# Patient Record
Sex: Female | Born: 1950 | Race: White | Hispanic: No | Marital: Married | Smoking: Never smoker
Health system: Southern US, Community
[De-identification: ages and names within clinical notes are randomized; demographics above are authoritative.]

## PROBLEM LIST (undated history)

## (undated) ENCOUNTER — Emergency Department (HOSPITAL_COMMUNITY): Admission: EM | Payer: No Typology Code available for payment source | Source: Home / Self Care

## (undated) DIAGNOSIS — E7800 Pure hypercholesterolemia, unspecified: Secondary | ICD-10-CM

## (undated) DIAGNOSIS — I10 Essential (primary) hypertension: Secondary | ICD-10-CM

## (undated) DIAGNOSIS — T148XXA Other injury of unspecified body region, initial encounter: Secondary | ICD-10-CM

## (undated) DIAGNOSIS — I83009 Varicose veins of unspecified lower extremity with ulcer of unspecified site: Secondary | ICD-10-CM

## (undated) DIAGNOSIS — R51 Headache: Secondary | ICD-10-CM

## (undated) DIAGNOSIS — Q639 Congenital malformation of kidney, unspecified: Secondary | ICD-10-CM

## (undated) DIAGNOSIS — F0390 Unspecified dementia without behavioral disturbance: Secondary | ICD-10-CM

## (undated) DIAGNOSIS — K219 Gastro-esophageal reflux disease without esophagitis: Secondary | ICD-10-CM

## (undated) DIAGNOSIS — L97909 Non-pressure chronic ulcer of unspecified part of unspecified lower leg with unspecified severity: Secondary | ICD-10-CM

## (undated) DIAGNOSIS — R519 Headache, unspecified: Secondary | ICD-10-CM

## (undated) DIAGNOSIS — R410 Disorientation, unspecified: Secondary | ICD-10-CM

## (undated) HISTORY — DX: Headache, unspecified: R51.9

## (undated) HISTORY — DX: Congenital malformation of kidney, unspecified: Q63.9

## (undated) HISTORY — DX: Headache: R51

## (undated) HISTORY — PX: ABDOMINAL HYSTERECTOMY: SHX81

## (undated) HISTORY — PX: CHOLECYSTECTOMY: SHX55

## (undated) HISTORY — DX: Pure hypercholesterolemia, unspecified: E78.00

## (undated) HISTORY — PX: APPENDECTOMY: SHX54

---

## 2016-07-22 ENCOUNTER — Ambulatory Visit (INDEPENDENT_AMBULATORY_CARE_PROVIDER_SITE_OTHER): Payer: Medicare Other | Admitting: Diagnostic Neuroimaging

## 2016-07-22 ENCOUNTER — Encounter: Payer: Self-pay | Admitting: Diagnostic Neuroimaging

## 2016-07-22 VITALS — BP 145/77 | HR 72 | Ht 64.0 in | Wt 190.0 lb

## 2016-07-22 DIAGNOSIS — R4689 Other symptoms and signs involving appearance and behavior: Secondary | ICD-10-CM | POA: Diagnosis not present

## 2016-07-22 DIAGNOSIS — R413 Other amnesia: Secondary | ICD-10-CM

## 2016-07-22 DIAGNOSIS — E538 Deficiency of other specified B group vitamins: Secondary | ICD-10-CM | POA: Diagnosis not present

## 2016-07-22 NOTE — Patient Instructions (Addendum)
Thank you for coming to see [place] at [facility]. I hope we have been able to provide you high quality care today.  You may receive a patient satisfaction survey over the next few weeks. We would appreciate your feedback and comments so that we may continue to improve ourselves and the health of our patients.  - continue B12 injections  - caution with bills, staying alone, driving  - look up MIND and DASH diets     ~~~~~~~~~~~~~~~~~~~~~~~~~~~~~~~~~~~~~~~~~~~~~~~~~~~~~~~~~~~~~~~~~  DR. Dalonda Simoni'S GUIDE TO HAPPY AND HEALTHY LIVING These are some of my general health and wellness recommendations. Some of them may apply to you better than others. Please use common sense as you try these suggestions and feel free to ask me any questions.   ACTIVITY/FITNESS Mental, social, emotional and physical stimulation are very important for brain and body health. Try learning a new activity (arts, music, language, sports, games).  Keep moving your body to the best of your abilities. You can do this at home, inside or outside, the park, community center, gym or anywhere you like. Consider a physical therapist or personal trainer to get started. Consider the app Sworkit. Fitness trackers such as smart-watches, smart-phones or Fitbits can help as well.   NUTRITION Eat more plants: colorful vegetables, nuts, seeds and berries.  Eat less sugar, salt, preservatives and processed foods.  Avoid toxins such as cigarettes and alcohol.  Drink water when you are thirsty. Warm water with a slice of lemon is an excellent morning drink to start the day.  Consider these websites for more information The Nutrition Source (https://www.henry-hernandez.biz/) Precision Nutrition (WindowBlog.ch)   RELAXATION Consider practicing mindfulness meditation or other relaxation techniques such as deep breathing, prayer, yoga, tai chi, massage. See website  mindful.org or the apps Headspace or Calm to help get started.   SLEEP Try to get at least 7-8+ hours sleep per day. Regular exercise and reduced caffeine will help you sleep better. Practice good sleep hygeine techniques. See website sleep.org for more information.   PLANNING Prepare estate planning, living will, healthcare POA documents. Sometimes this is best planned with the help of an attorney. Theconversationproject.org and agingwithdignity.org are excellent resources.

## 2016-07-22 NOTE — Progress Notes (Signed)
[facility]  PATIENT: Hailey Ortiz DOB: [birth year]  REFERRING CLINICIAN: P Eason HISTORY FROM: patient, husband, daughter REASON FOR VISIT: new consult    HISTORICAL  CHIEF COMPLAINT:  Chief Complaint  Patient presents with  . Memory Loss    rm 7, husband-Kenyt, dgtr- Melody, MMSE 24    HISTORY OF PRESENT ILLNESS:   65 year old female here for evaluation of memory loss, personality change, language difficulty. Patient has history of hypercholesterolemia. September 2016 patient had onset of memory loss, personality behavior change. She also changed her diet to yogurt only for 3 months. Patient was having decreased by mouth intake. In the last year she has lost 156 pounds. In July 2017 patient was having increasing language difficulty, balance and gait difficulty, falling down. Patient was evaluated with lab testing and MRI of the brain. Patient was found to have vitamin B12 level of 80. She started on replacement approximately 4 weeks ago. Symptoms have slightly improved. MRI of the brain showed chronic small vessel ischemic changes and mild atrophy.  Patient has been slightly more argumentative, confused, and having difficulty keeping up her day-to-day activities. Apparently she still takes her own medications, drives a car, shops.     REVIEW OF SYSTEMS: Full 14 system review of systems performed and negative with exception of: Weight loss fatigue swelling in legs diarrhea feeling cold increased thirst frequent infections depression change in appetite disinterest in activities insomnia sleepiness memory loss confusion headache.  ALLERGIES: Allergies  Allergen Reactions  . Furosemide Nausea And Vomiting and Other (See Comments)    Passes out   . Lisinopril     She does not want to take it    HOME MEDICATIONS: No outpatient prescriptions prior to visit.   No facility-administered medications prior to visit.     PAST MEDICAL HISTORY: Past Medical  History:  Diagnosis Date  . Congenital abnormality of kidney   . Headache    migraine  . Hypercholesteremia   . Stroke [facility])     PAST SURGICAL HISTORY: Past Surgical History:  Procedure Laterality Date  . ABDOMINAL HYSTERECTOMY     total  . APPENDECTOMY    . CHOLECYSTECTOMY      FAMILY HISTORY: Family History  Problem Relation Age of Onset  . Heart failure Mother   . Kidney Stones Mother   . Macular degeneration Mother   . Rectal cancer Father   . Arthritis/Rheumatoid Father   . Cancer Father     lymph node    SOCIAL HISTORY:  Social History   Social History  . Marital status: Married    Spouse name: Rudell Cobb  . Number of children: 2  . Years of education: 14   Occupational History  .      retired,  Forensic psychologist   Social History Main Topics  . Smoking status: Never Smoker  . Smokeless tobacco: Never Used  . Alcohol use No  . Drug use: No  . Sexual activity: Not on file   Other Topics Concern  . Not on file   Social History Narrative   Lives at home with husband   Caffeine -none     PHYSICAL EXAM  GENERAL EXAM/CONSTITUTIONAL: Vitals:  Vitals:   07/22/16 1515  BP: (!) 145/77  Pulse: 72  Weight: 190 lb (86.2 kg)  Height: 5\' 4"  (1.626 m)     Body mass index is 32.61 kg/m.  Visual Acuity Screening   Right eye Left eye Both eyes  Without correction:  With correction: 20/50 20/50      Patient is in no distress; well developed, nourished and groomed; neck is supple  CARDIOVASCULAR:  Examination of carotid arteries is normal; no carotid bruits  Regular rate and rhythm, no murmurs  Examination of peripheral vascular system by observation and palpation is normal  EYES:  Ophthalmoscopic exam of optic discs and posterior segments is normal; no papilledema or hemorrhages  MUSCULOSKELETAL:  Gait, strength, tone, movements noted in Neurologic exam below  NEUROLOGIC: MENTAL STATUS:  MMSE - Mini Mental State Exam 07/22/2016    Orientation to time 5  Orientation to Place 4  Registration 3  Attention/ Calculation 4  Recall 0  Language- name 2 objects 2  Language- repeat 1  Language- follow 3 step command 3  Language- read & follow direction 1  Write a sentence 1  Copy design 0  Total score 24    awake, alert, oriented to person, place and time  recent and remote memory intact  normal attention and concentration  language fluent, comprehension intact, naming intact,   fund of knowledge appropriate  PERSEVERATIVE  ARGUMENTATIVE    CRANIAL NERVE:   2nd - no papilledema on fundoscopic exam  2nd, 3rd, 4th, 6th - pupils equal and reactive to light, visual fields full to confrontation, extraocular muscles intact, no nystagmus  5th - facial sensation symmetric  7th - facial strength symmetric  8th - hearing intact  9th - palate elevates symmetrically, uvula midline  11th - shoulder shrug symmetric  12th - tongue protrusion midline  MOTOR:   MILD MOTOR APRAXIA  normal bulk and tone, full strength in the BUE, BLE  SENSORY:   normal and symmetric to light touch, temperature, vibration  COORDINATION:   finger-nose-finger, fine finger movements normal  REFLEXES:   deep tendon reflexes TRACE and symmetric  GAIT/STATION:   narrow based gait; romberg is negative    DIAGNOSTIC DATA (LABS, IMAGING, TESTING) - I reviewed patient records, labs, notes, testing and imaging myself where available.  No results found for: WBC, HGB, HCT, MCV, PLT No results found for: NA, K, CL, CO2, GLUCOSE, BUN, CREATININE, CALCIUM, PROT, ALBUMIN, AST, ALT, ALKPHOS, BILITOT, GFRNONAA, GFRAA No results found for: CHOL, HDL, LDLCALC, LDLDIRECT, TRIG, CHOLHDL No results found for: [id number] No results found for: VITAMINB12 No results found for: TSH   2017 MRI brain [I reviewed images myself and agree with interpretation. -VRP]  - Mild chronic small vessel ischemic disease, including small focus in the  left pons - Mild bilateral perisylvian atrophy - No acute findings     ASSESSMENT AND PLAN  65 y.o. year old female here with progressive personality, language, cognitive decline since September 2016 with severe changes in July 2017. Symptoms slightly improving with B12 replacement. Could represent B12 deficiency versus underlying neurodegenerative process and secondary B12 deficiency due to dietary or GI absorption changes.  Ddx: neurodegenerative (dementia; FTD) vs metabolic (B12 deficiency)  1. Memory loss   2. Behavioral change   3. B12 deficiency      PLAN: - continue B12 injections - check B12 level and TSH with Dr. Maryellen PileEason - increase safety and supervision - caution with driving, bills, staying alone  Return in about 2 months (around 09/21/2016).  I reviewed images, labs, notes, records myself. I summarized findings and reviewed with patient, for this high risk condition (new onset dementia vs B12 deficiency; gait difficulty) requiring high complexity decision making.     Suanne MarkerVIKRAM R. Younes Degeorge, MD 07/22/2016, 4:10 PM Certified  in Neurology, Neurophysiology and Neuroimaging  [facility] [address], [place], [place] [address] [phone]

## 2016-10-16 ENCOUNTER — Ambulatory Visit (INDEPENDENT_AMBULATORY_CARE_PROVIDER_SITE_OTHER): Payer: Medicare Other | Admitting: Diagnostic Neuroimaging

## 2016-10-16 ENCOUNTER — Encounter: Payer: Self-pay | Admitting: Diagnostic Neuroimaging

## 2016-10-16 VITALS — BP 148/83 | HR 68 | Wt 186.0 lb

## 2016-10-16 DIAGNOSIS — R413 Other amnesia: Secondary | ICD-10-CM | POA: Diagnosis not present

## 2016-10-16 DIAGNOSIS — E538 Deficiency of other specified B group vitamins: Secondary | ICD-10-CM

## 2016-10-16 DIAGNOSIS — R4689 Other symptoms and signs involving appearance and behavior: Secondary | ICD-10-CM | POA: Diagnosis not present

## 2016-10-16 NOTE — Progress Notes (Signed)
[facility]  PATIENT: Hailey Ortiz DOB: 1951/07/28  REFERRING CLINICIAN: P Eason HISTORY FROM: patient, husband, daughter REASON FOR VISIT: follow up   HISTORICAL  CHIEF COMPLAINT:  Chief Complaint  Patient presents with  . Memory Loss    rm 6, husband- Rudell CobbKent, dgtrToniann Fail- Wendy, MMSE 23  . Follow-up    3 month    HISTORY OF PRESENT ILLNESS:   UPDATE 10/16/16: Since last visit, patient denies any memory problems. Husband thinnks symptoms are slightly better. Daughter thinks sxs are worsening. Patient initially calm, but getting increasingly agitated towards end of visit, refusing any follow up visits, medications, or treatments.   PRIOR HPI (07/22/16): 66 year old female here for evaluation of memory loss, personality change, language difficulty. Patient has history of hypercholesterolemia. September 2016 patient had onset of memory loss, personality behavior change. She also changed her diet to yogurt only for 3 months. Patient was having decreased by mouth intake. In the last year she has lost 156 pounds. In July 2017 patient was having increasing language difficulty, balance and gait difficulty, falling down. Patient was evaluated with lab testing and MRI of the brain. Patient was found to have vitamin B12 level of 80. She started on replacement approximately 4 weeks ago. Symptoms have slightly improved. MRI of the brain showed chronic small vessel ischemic changes and mild atrophy. Patient has been slightly more argumentative, confused, and having difficulty keeping up her day-to-day activities. Apparently she still takes her own medications, drives a car, shops.   REVIEW OF SYSTEMS: Full 14 system review of systems performed and negative with exception of: decr appetite swelling in legs diarrhea feeling cold increased thirst frequent infections depression change in appetite disinterest in activities insomnia sleepiness memory loss confusion headache agitation behavior.     ALLERGIES: Allergies  Allergen Reactions  . Furosemide Nausea And Vomiting and Other (See Comments)    Passes out   . Lisinopril     She does not want to take it    HOME MEDICATIONS: Outpatient Medications Prior to Visit  Medication Sig Dispense Refill  . cholestyramine (QUESTRAN) 4 g packet 4 g 2 (two) times daily.    . citalopram (CELEXA) 40 MG tablet 40 mg daily.    . cyanocobalamin (,VITAMIN B-12,) 1000 MCG/ML injection Inject 1,000 mcg into the muscle every 7 (seven) days.    . famotidine (PEPCID) 20 MG tablet Take 20 mg by mouth 2 (two) times daily.    . folic acid (FOLVITE) 400 MCG tablet Take 400 mcg by mouth daily.    . metoprolol succinate (TOPROL-XL) 50 MG 24 hr tablet Take 50 mg by mouth daily.  5  . simvastatin (ZOCOR) 40 MG tablet 40 mg daily.     No facility-administered medications prior to visit.     PAST MEDICAL HISTORY: Past Medical History:  Diagnosis Date  . Congenital abnormality of kidney   . Headache    migraine  . Hypercholesteremia   . Stroke [facility])     PAST SURGICAL HISTORY: Past Surgical History:  Procedure Laterality Date  . ABDOMINAL HYSTERECTOMY     total  . APPENDECTOMY    . CHOLECYSTECTOMY      FAMILY HISTORY: Family History  Problem Relation Age of Onset  . Heart failure Mother   . Kidney Stones Mother   . Macular degeneration Mother   . Rectal cancer Father   . Arthritis/Rheumatoid Father   . Cancer Father     lymph node    SOCIAL HISTORY:  Social History   Social History  . Marital status: Married    Spouse name: Rudell Cobb  . Number of children: 2  . Years of education: 14   Occupational History  .      retired,  Forensic psychologist   Social History Main Topics  . Smoking status: Never Smoker  . Smokeless tobacco: Never Used  . Alcohol use No  . Drug use: No  . Sexual activity: Not on file   Other Topics Concern  . Not on file   Social History Narrative   Lives at home with husband   Caffeine -none      PHYSICAL EXAM  GENERAL EXAM/CONSTITUTIONAL: Vitals:  Vitals:   10/16/16 1417  BP: (!) 148/83  Pulse: 68  Weight: 186 lb (84.4 kg)   Wt Readings from Last 3 Encounters:  10/16/16 186 lb (84.4 kg)  07/22/16 190 lb (86.2 kg)   Body mass index is 31.93 kg/m. No exam data present  Patient is in no distress; well developed, nourished and groomed; neck is supple  CARDIOVASCULAR:  Examination of carotid arteries is normal; no carotid bruits  Regular rate and rhythm, no murmurs  Examination of peripheral vascular system by observation and palpation is normal  EYES:  Ophthalmoscopic exam of optic discs and posterior segments is normal; no papilledema or hemorrhages  MUSCULOSKELETAL:  Gait, strength, tone, movements noted in Neurologic exam below  NEUROLOGIC: MENTAL STATUS:  MMSE - Mini Mental State Exam 10/16/2016 07/22/2016  Orientation to time 4 5  Orientation to Place 3 4  Registration 3 3  Attention/ Calculation 5 4  Recall 1 0  Language- name 2 objects 2 2  Language- repeat 1 1  Language- follow 3 step command 3 3  Language- read & follow direction 0 1  Write a sentence 1 1  Copy design 0 0  Copy design-comments traced the design -  Total score 23 24    awake, alert, oriented to person, place and time  recent and remote memory intact  normal attention and concentration  language fluent, comprehension intact, naming intact,   fund of knowledge appropriate  PERSEVERATIVE  ARGUMENTATIVE  ANXIOUS APPEARING  CRANIAL NERVE:   2nd - no papilledema on fundoscopic exam  2nd, 3rd, 4th, 6th - pupils equal and reactive to light, visual fields full to confrontation, extraocular muscles intact, no nystagmus  5th - facial sensation symmetric  7th - facial strength symmetric  8th - hearing intact  9th - palate elevates symmetrically, uvula midline  11th - shoulder shrug symmetric  12th - tongue protrusion midline  MOTOR:   normal bulk and  tone, full strength in the BUE, BLE  SENSORY:   normal and symmetric to light touch, temperature, vibration  COORDINATION:   finger-nose-finger, fine finger movements normal  REFLEXES:   deep tendon reflexes TRACE and symmetric  GAIT/STATION:   narrow based gait; romberg is negative    DIAGNOSTIC DATA (LABS, IMAGING, TESTING) - I reviewed patient records, labs, notes, testing and imaging myself where available.  No results found for: WBC, HGB, HCT, MCV, PLT No results found for: NA, K, CL, CO2, GLUCOSE, BUN, CREATININE, CALCIUM, PROT, ALBUMIN, AST, ALT, ALKPHOS, BILITOT, GFRNONAA, GFRAA No results found for: CHOL, HDL, LDLCALC, LDLDIRECT, TRIG, CHOLHDL No results found for: ZOXW9U No results found for: VITAMINB12 No results found for: TSH   2017 MRI brain [I reviewed images myself and agree with interpretation. -VRP]  - Mild chronic small vessel ischemic disease, including small focus  in the left pons - Mild bilateral perisylvian atrophy - No acute findings     ASSESSMENT AND PLAN  66 y.o. year old female here with progressive personality, language, cognitive decline since September 2016 with severe changes in July 2017. Symptoms slightly improving with B12 replacement. 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 Underlying psychiatry dz also a factor.   Ddx: neurodegenerative (dementia; FTD) vs metabolic (B12 deficiency)  1. Memory loss   2. Behavioral change   3. B12 deficiency      PLAN: - continue B12 injections - check B12 level and TSH with Dr. Maryellen Pile - increase safety and supervision - caution with driving, bills, staying alone - may need to consider other mood stabilizing medication (depakote) or sleep aid (mirtazipine) or other SSRI (currently on citalopram); may benefit from geriatric psychiatry evaluation  Return in about 4 months (around  02/13/2017).     Suanne Marker, MD 10/16/2016, 2:37 PM Certified in Neurology, Neurophysiology and Neuroimaging  [facility] [address] [place], [place] [address] [phone]

## 2017-07-17 ENCOUNTER — Inpatient Hospital Stay (HOSPITAL_COMMUNITY)
Admission: EM | Admit: 2017-07-17 | Discharge: 2017-07-27 | DRG: 571 | Disposition: A | Discharge status: Home / Self Care | Payer: Medicare Other | Attending: Family Medicine | Admitting: Family Medicine

## 2017-07-17 ENCOUNTER — Emergency Department (HOSPITAL_COMMUNITY): Payer: Medicare Other

## 2017-07-17 ENCOUNTER — Encounter (HOSPITAL_COMMUNITY): Payer: Self-pay | Admitting: Emergency Medicine

## 2017-07-17 DIAGNOSIS — E785 Hyperlipidemia, unspecified: Secondary | ICD-10-CM | POA: Diagnosis present

## 2017-07-17 DIAGNOSIS — I10 Essential (primary) hypertension: Secondary | ICD-10-CM | POA: Diagnosis present

## 2017-07-17 DIAGNOSIS — Z888 Allergy status to other drugs, medicaments and biological substances status: Secondary | ICD-10-CM

## 2017-07-17 DIAGNOSIS — L03115 Cellulitis of right lower limb: Secondary | ICD-10-CM | POA: Diagnosis not present

## 2017-07-17 DIAGNOSIS — E538 Deficiency of other specified B group vitamins: Secondary | ICD-10-CM | POA: Diagnosis present

## 2017-07-17 DIAGNOSIS — R4189 Other symptoms and signs involving cognitive functions and awareness: Secondary | ICD-10-CM | POA: Diagnosis present

## 2017-07-17 DIAGNOSIS — Z800 Family history of malignant neoplasm of digestive organs: Secondary | ICD-10-CM

## 2017-07-17 DIAGNOSIS — Z9049 Acquired absence of other specified parts of digestive tract: Secondary | ICD-10-CM

## 2017-07-17 DIAGNOSIS — S8011XS Contusion of right lower leg, sequela: Secondary | ICD-10-CM

## 2017-07-17 DIAGNOSIS — M2540 Effusion, unspecified joint: Secondary | ICD-10-CM

## 2017-07-17 DIAGNOSIS — Z8673 Personal history of transient ischemic attack (TIA), and cerebral infarction without residual deficits: Secondary | ICD-10-CM

## 2017-07-17 DIAGNOSIS — Z8261 Family history of arthritis: Secondary | ICD-10-CM

## 2017-07-17 DIAGNOSIS — D649 Anemia, unspecified: Secondary | ICD-10-CM | POA: Diagnosis present

## 2017-07-17 DIAGNOSIS — S8001XA Contusion of right knee, initial encounter: Principal | ICD-10-CM | POA: Diagnosis present

## 2017-07-17 DIAGNOSIS — N390 Urinary tract infection, site not specified: Secondary | ICD-10-CM | POA: Diagnosis not present

## 2017-07-17 DIAGNOSIS — L02419 Cutaneous abscess of limb, unspecified: Secondary | ICD-10-CM

## 2017-07-17 DIAGNOSIS — L0390 Cellulitis, unspecified: Secondary | ICD-10-CM | POA: Diagnosis present

## 2017-07-17 DIAGNOSIS — K219 Gastro-esophageal reflux disease without esophagitis: Secondary | ICD-10-CM | POA: Diagnosis present

## 2017-07-17 DIAGNOSIS — B9620 Unspecified Escherichia coli [E. coli] as the cause of diseases classified elsewhere: Secondary | ICD-10-CM | POA: Diagnosis not present

## 2017-07-17 DIAGNOSIS — Z8249 Family history of ischemic heart disease and other diseases of the circulatory system: Secondary | ICD-10-CM

## 2017-07-17 DIAGNOSIS — M7989 Other specified soft tissue disorders: Secondary | ICD-10-CM | POA: Diagnosis present

## 2017-07-17 DIAGNOSIS — E7800 Pure hypercholesterolemia, unspecified: Secondary | ICD-10-CM | POA: Diagnosis present

## 2017-07-17 DIAGNOSIS — M2550 Pain in unspecified joint: Secondary | ICD-10-CM

## 2017-07-17 DIAGNOSIS — S83241A Other tear of medial meniscus, current injury, right knee, initial encounter: Secondary | ICD-10-CM | POA: Diagnosis present

## 2017-07-17 DIAGNOSIS — F419 Anxiety disorder, unspecified: Secondary | ICD-10-CM | POA: Diagnosis not present

## 2017-07-17 DIAGNOSIS — G43909 Migraine, unspecified, not intractable, without status migrainosus: Secondary | ICD-10-CM | POA: Diagnosis present

## 2017-07-17 DIAGNOSIS — Z7982 Long term (current) use of aspirin: Secondary | ICD-10-CM

## 2017-07-17 DIAGNOSIS — Z90710 Acquired absence of both cervix and uterus: Secondary | ICD-10-CM

## 2017-07-17 LAB — URINALYSIS, ROUTINE W REFLEX MICROSCOPIC
BILIRUBIN URINE: NEGATIVE
Glucose, UA: NEGATIVE mg/dL
Hgb urine dipstick: NEGATIVE
Ketones, ur: NEGATIVE mg/dL
Nitrite: POSITIVE — AB
PH: 6 (ref 5.0–8.0)
Protein, ur: NEGATIVE mg/dL
SPECIFIC GRAVITY, URINE: 1.017 (ref 1.005–1.030)

## 2017-07-17 LAB — COMPREHENSIVE METABOLIC PANEL
ALK PHOS: 59 U/L (ref 38–126)
ALT: 8 U/L — AB (ref 14–54)
AST: 14 U/L — AB (ref 15–41)
Albumin: 3.2 g/dL — ABNORMAL LOW (ref 3.5–5.0)
Anion gap: 7 (ref 5–15)
BILIRUBIN TOTAL: 0.9 mg/dL (ref 0.3–1.2)
BUN: 13 mg/dL (ref 6–20)
CALCIUM: 8.5 mg/dL — AB (ref 8.9–10.3)
CO2: 25 mmol/L (ref 22–32)
CREATININE: 0.75 mg/dL (ref 0.44–1.00)
Chloride: 102 mmol/L (ref 101–111)
Glucose, Bld: 95 mg/dL (ref 65–99)
Potassium: 3.9 mmol/L (ref 3.5–5.1)
Sodium: 134 mmol/L — ABNORMAL LOW (ref 135–145)
TOTAL PROTEIN: 6 g/dL — AB (ref 6.5–8.1)

## 2017-07-17 LAB — CBC WITH DIFFERENTIAL/PLATELET
BASOS ABS: 0 10*3/uL (ref 0.0–0.1)
Basophils Relative: 0 %
Eosinophils Absolute: 0.1 10*3/uL (ref 0.0–0.7)
Eosinophils Relative: 1 %
HEMATOCRIT: 24.1 % — AB (ref 36.0–46.0)
Hemoglobin: 7 g/dL — ABNORMAL LOW (ref 12.0–15.0)
LYMPHS ABS: 1.6 10*3/uL (ref 0.7–4.0)
LYMPHS PCT: 22 %
MCH: 27.3 pg (ref 26.0–34.0)
MCHC: 29 g/dL — ABNORMAL LOW (ref 30.0–36.0)
MCV: 94.1 fL (ref 78.0–100.0)
Monocytes Absolute: 0.7 10*3/uL (ref 0.1–1.0)
Monocytes Relative: 9 %
NEUTROS ABS: 4.9 10*3/uL (ref 1.7–7.7)
Neutrophils Relative %: 68 %
PLATELETS: 223 10*3/uL (ref 150–400)
RBC: 2.56 MIL/uL — AB (ref 3.87–5.11)
RDW: 15.2 % (ref 11.5–15.5)
WBC: 7.3 10*3/uL (ref 4.0–10.5)

## 2017-07-17 LAB — I-STAT CG4 LACTIC ACID, ED: Lactic Acid, Venous: 0.74 mmol/L (ref 0.5–1.9)

## 2017-07-17 LAB — PROTIME-INR
INR: 1.13
PROTHROMBIN TIME: 14.4 s (ref 11.4–15.2)

## 2017-07-17 MED ORDER — CEFAZOLIN SODIUM-DEXTROSE 1-4 GM/50ML-% IV SOLN
1.0000 g | Freq: Once | INTRAVENOUS | Status: AC
  Administered 2017-07-18: 1 g via INTRAVENOUS
  Filled 2017-07-17: qty 50
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
## 2017-07-17 NOTE — ED Triage Notes (Signed)
Patient reports worsening right medial knee pain with swelling and redness onset 1 1/2 weeks ago , denies fall or injury .

## 2017-07-18 ENCOUNTER — Encounter (HOSPITAL_COMMUNITY): Payer: Self-pay

## 2017-07-18 ENCOUNTER — Inpatient Hospital Stay (HOSPITAL_COMMUNITY): Payer: Medicare Other

## 2017-07-18 DIAGNOSIS — K219 Gastro-esophageal reflux disease without esophagitis: Secondary | ICD-10-CM | POA: Diagnosis present

## 2017-07-18 DIAGNOSIS — I10 Essential (primary) hypertension: Secondary | ICD-10-CM | POA: Diagnosis present

## 2017-07-18 DIAGNOSIS — R0600 Dyspnea, unspecified: Secondary | ICD-10-CM

## 2017-07-18 DIAGNOSIS — Z8673 Personal history of transient ischemic attack (TIA), and cerebral infarction without residual deficits: Secondary | ICD-10-CM | POA: Diagnosis not present

## 2017-07-18 DIAGNOSIS — Z800 Family history of malignant neoplasm of digestive organs: Secondary | ICD-10-CM | POA: Diagnosis not present

## 2017-07-18 DIAGNOSIS — B9620 Unspecified Escherichia coli [E. coli] as the cause of diseases classified elsewhere: Secondary | ICD-10-CM | POA: Diagnosis not present

## 2017-07-18 DIAGNOSIS — G43909 Migraine, unspecified, not intractable, without status migrainosus: Secondary | ICD-10-CM | POA: Diagnosis present

## 2017-07-18 DIAGNOSIS — L03115 Cellulitis of right lower limb: Secondary | ICD-10-CM

## 2017-07-18 DIAGNOSIS — D649 Anemia, unspecified: Secondary | ICD-10-CM | POA: Diagnosis present

## 2017-07-18 DIAGNOSIS — M2540 Effusion, unspecified joint: Secondary | ICD-10-CM | POA: Diagnosis not present

## 2017-07-18 DIAGNOSIS — M7989 Other specified soft tissue disorders: Secondary | ICD-10-CM

## 2017-07-18 DIAGNOSIS — Z888 Allergy status to other drugs, medicaments and biological substances status: Secondary | ICD-10-CM | POA: Diagnosis not present

## 2017-07-18 DIAGNOSIS — S83241A Other tear of medial meniscus, current injury, right knee, initial encounter: Secondary | ICD-10-CM | POA: Diagnosis present

## 2017-07-18 DIAGNOSIS — M25561 Pain in right knee: Secondary | ICD-10-CM | POA: Diagnosis not present

## 2017-07-18 DIAGNOSIS — Z8249 Family history of ischemic heart disease and other diseases of the circulatory system: Secondary | ICD-10-CM | POA: Diagnosis not present

## 2017-07-18 DIAGNOSIS — Z9049 Acquired absence of other specified parts of digestive tract: Secondary | ICD-10-CM | POA: Diagnosis not present

## 2017-07-18 DIAGNOSIS — E538 Deficiency of other specified B group vitamins: Secondary | ICD-10-CM | POA: Diagnosis present

## 2017-07-18 DIAGNOSIS — Z90710 Acquired absence of both cervix and uterus: Secondary | ICD-10-CM | POA: Diagnosis not present

## 2017-07-18 DIAGNOSIS — L03213 Periorbital cellulitis: Secondary | ICD-10-CM | POA: Diagnosis not present

## 2017-07-18 DIAGNOSIS — N390 Urinary tract infection, site not specified: Secondary | ICD-10-CM | POA: Diagnosis not present

## 2017-07-18 DIAGNOSIS — F419 Anxiety disorder, unspecified: Secondary | ICD-10-CM | POA: Diagnosis not present

## 2017-07-18 DIAGNOSIS — R4189 Other symptoms and signs involving cognitive functions and awareness: Secondary | ICD-10-CM | POA: Diagnosis present

## 2017-07-18 DIAGNOSIS — Z8261 Family history of arthritis: Secondary | ICD-10-CM | POA: Diagnosis not present

## 2017-07-18 DIAGNOSIS — E785 Hyperlipidemia, unspecified: Secondary | ICD-10-CM | POA: Diagnosis present

## 2017-07-18 DIAGNOSIS — Z7982 Long term (current) use of aspirin: Secondary | ICD-10-CM | POA: Diagnosis not present

## 2017-07-18 DIAGNOSIS — L02419 Cutaneous abscess of limb, unspecified: Secondary | ICD-10-CM | POA: Diagnosis not present

## 2017-07-18 DIAGNOSIS — T148XXA Other injury of unspecified body region, initial encounter: Secondary | ICD-10-CM | POA: Diagnosis not present

## 2017-07-18 DIAGNOSIS — L0390 Cellulitis, unspecified: Secondary | ICD-10-CM | POA: Diagnosis present

## 2017-07-18 DIAGNOSIS — E7800 Pure hypercholesterolemia, unspecified: Secondary | ICD-10-CM | POA: Diagnosis present

## 2017-07-18 DIAGNOSIS — S8001XA Contusion of right knee, initial encounter: Secondary | ICD-10-CM | POA: Diagnosis present

## 2017-07-18 LAB — COMPREHENSIVE METABOLIC PANEL
ALBUMIN: 2.9 g/dL — AB (ref 3.5–5.0)
ALK PHOS: 53 U/L (ref 38–126)
ALT: 7 U/L — AB (ref 14–54)
ANION GAP: 7 (ref 5–15)
AST: 11 U/L — ABNORMAL LOW (ref 15–41)
BUN: 10 mg/dL (ref 6–20)
CALCIUM: 8.2 mg/dL — AB (ref 8.9–10.3)
CO2: 26 mmol/L (ref 22–32)
CREATININE: 0.76 mg/dL (ref 0.44–1.00)
Chloride: 106 mmol/L (ref 101–111)
GFR calc Af Amer: >60 mL/min (ref >60)
GFR calc non Af Amer: >60 mL/min (ref >60)
GLUCOSE: 84 mg/dL (ref 65–99)
Potassium: 3.8 mmol/L (ref 3.5–5.1)
SODIUM: 139 mmol/L (ref 135–145)
Total Bilirubin: 0.9 mg/dL (ref 0.3–1.2)
Total Protein: 5.4 g/dL — ABNORMAL LOW (ref 6.5–8.1)

## 2017-07-18 LAB — FERRITIN: FERRITIN: 71 ng/mL (ref 11–307)

## 2017-07-18 LAB — ECHOCARDIOGRAM COMPLETE
HEIGHTINCHES: 69 in
Weight: 3153.46 oz

## 2017-07-18 LAB — RETICULOCYTES
RBC.: 2.35 MIL/uL — ABNORMAL LOW (ref 3.87–5.11)
Retic Count, Absolute: 89.3 10*3/uL (ref 19.0–186.0)
Retic Ct Pct: 3.8 % — ABNORMAL HIGH (ref 0.4–3.1)

## 2017-07-18 LAB — IRON AND TIBC
IRON: 19 ug/dL — AB (ref 28–170)
Saturation Ratios: 7 % — ABNORMAL LOW (ref 10.4–31.8)
TIBC: 259 ug/dL (ref 250–450)
UIBC: 240 ug/dL

## 2017-07-18 LAB — CBC
HEMATOCRIT: 22.2 % — AB (ref 36.0–46.0)
Hemoglobin: 6.7 g/dL — CL (ref 12.0–15.0)
MCH: 28.5 pg (ref 26.0–34.0)
MCHC: 30.2 g/dL (ref 30.0–36.0)
MCV: 94.5 fL (ref 78.0–100.0)
Platelets: 207 10*3/uL (ref 150–400)
RBC: 2.35 MIL/uL — AB (ref 3.87–5.11)
RDW: 15.8 % — ABNORMAL HIGH (ref 11.5–15.5)
WBC: 4.9 10*3/uL (ref 4.0–10.5)

## 2017-07-18 LAB — HEMOGLOBIN AND HEMATOCRIT, BLOOD
HEMATOCRIT: 29 % — AB (ref 36.0–46.0)
HEMOGLOBIN: 9.1 g/dL — AB (ref 12.0–15.0)

## 2017-07-18 LAB — HIV ANTIBODY (ROUTINE TESTING W REFLEX): HIV SCREEN 4TH GENERATION: NONREACTIVE

## 2017-07-18 LAB — BRAIN NATRIURETIC PEPTIDE: B Natriuretic Peptide: 224.7 pg/mL — ABNORMAL HIGH (ref 0.0–100.0)

## 2017-07-18 LAB — FOLATE: Folate: 23.2 ng/mL (ref >5.9)

## 2017-07-18 LAB — VITAMIN B12: VITAMIN B 12: 626 pg/mL (ref 180–914)

## 2017-07-18 LAB — ABO/RH: ABO/RH(D): A POS

## 2017-07-18 LAB — TSH: TSH: 1.945 u[IU]/mL (ref 0.350–4.500)

## 2017-07-18 LAB — PREPARE RBC (CROSSMATCH)

## 2017-07-18 LAB — I-STAT CG4 LACTIC ACID, ED: Lactic Acid, Venous: 1.26 mmol/L (ref 0.5–1.9)

## 2017-07-18 MED ORDER — PANTOPRAZOLE SODIUM 40 MG PO TBEC
40.0000 mg | DELAYED_RELEASE_TABLET | Freq: Every day | ORAL | Status: DC
  Administered 2017-07-18 – 2017-07-27 (×10): 40 mg via ORAL
  Filled 2017-07-18 (×10): qty 1

## 2017-07-18 MED ORDER — ONDANSETRON HCL 4 MG/2ML IJ SOLN: 4.0000 mg | Freq: Four times a day (QID) | INTRAMUSCULAR | Status: DC | PRN

## 2017-07-18 MED ORDER — SODIUM CHLORIDE 0.9% FLUSH: 3.0000 mL | INTRAVENOUS | Status: DC | PRN

## 2017-07-18 MED ORDER — VANCOMYCIN HCL IN DEXTROSE 1-5 GM/200ML-% IV SOLN: 1000.0000 mg | Freq: Once | INTRAVENOUS | Status: DC

## 2017-07-18 MED ORDER — PRO-STAT SUGAR FREE PO LIQD
30.0000 mL | Freq: Two times a day (BID) | ORAL | Status: DC
  Administered 2017-07-18 – 2017-07-27 (×17): 30 mL via ORAL
  Filled 2017-07-18 (×18): qty 30

## 2017-07-18 MED ORDER — ASPIRIN 325 MG PO TABS
325.0000 mg | ORAL_TABLET | Freq: Every day | ORAL | Status: DC
  Administered 2017-07-18 – 2017-07-26 (×9): 325 mg via ORAL
  Filled 2017-07-18 (×9): qty 1

## 2017-07-18 MED ORDER — VANCOMYCIN HCL IN DEXTROSE 1-5 GM/200ML-% IV SOLN
1000.0000 mg | Freq: Once | INTRAVENOUS | Status: AC
  Administered 2017-07-18: 1000 mg via INTRAVENOUS
  Filled 2017-07-18: qty 200

## 2017-07-18 MED ORDER — SIMVASTATIN 40 MG PO TABS: 40.0000 mg | ORAL_TABLET | Freq: Every day | ORAL | Status: DC

## 2017-07-18 MED ORDER — FUROSEMIDE 10 MG/ML IJ SOLN
20.0000 mg | Freq: Once | INTRAMUSCULAR | Status: AC
  Administered 2017-07-18: 20 mg via INTRAVENOUS
  Filled 2017-07-18: qty 2

## 2017-07-18 MED ORDER — ONDANSETRON HCL 4 MG PO TABS: 4.0000 mg | ORAL_TABLET | Freq: Four times a day (QID) | ORAL | Status: DC | PRN

## 2017-07-18 MED ORDER — SODIUM CHLORIDE 0.9 % IV SOLN
Freq: Once | INTRAVENOUS | Status: AC
  Administered 2017-07-18: 08:00:00 via INTRAVENOUS

## 2017-07-18 MED ORDER — DIPHENHYDRAMINE HCL 25 MG PO CAPS
25.0000 mg | ORAL_CAPSULE | Freq: Once | ORAL | Status: AC
  Administered 2017-07-18: 25 mg via ORAL
  Filled 2017-07-18: qty 1

## 2017-07-18 MED ORDER — METOPROLOL SUCCINATE ER 25 MG PO TB24
25.0000 mg | ORAL_TABLET | Freq: Every day | ORAL | Status: DC
  Administered 2017-07-18 – 2017-07-27 (×9): 25 mg via ORAL
  Filled 2017-07-18 (×10): qty 1

## 2017-07-18 MED ORDER — VANCOMYCIN HCL IN DEXTROSE 1-5 GM/200ML-% IV SOLN
1000.0000 mg | Freq: Two times a day (BID) | INTRAVENOUS | Status: DC
Start: 2017-07-18 — End: 2017-07-21
  Administered 2017-07-18 – 2017-07-21 (×7): 1000 mg via INTRAVENOUS
  Filled 2017-07-18 (×8): qty 200

## 2017-07-18 MED ORDER — LORAZEPAM 2 MG/ML IJ SOLN
1.0000 mg | Freq: Four times a day (QID) | INTRAMUSCULAR | Status: DC | PRN
  Administered 2017-07-19 – 2017-07-21 (×5): 1 mg via INTRAVENOUS
  Filled 2017-07-18 (×5): qty 1

## 2017-07-18 MED ORDER — AMLODIPINE BESYLATE 10 MG PO TABS
10.0000 mg | ORAL_TABLET | Freq: Every day | ORAL | Status: DC
  Administered 2017-07-18 – 2017-07-25 (×8): 10 mg via ORAL
  Filled 2017-07-18 (×9): qty 1

## 2017-07-18 MED ORDER — PIPERACILLIN-TAZOBACTAM 3.375 G IVPB
3.3750 g | Freq: Three times a day (TID) | INTRAVENOUS | Status: DC
  Administered 2017-07-18 – 2017-07-22 (×11): 3.375 g via INTRAVENOUS
  Filled 2017-07-18 (×16): qty 50

## 2017-07-18 MED ORDER — PIPERACILLIN-TAZOBACTAM 3.375 G IVPB 30 MIN
3.3750 g | Freq: Once | INTRAVENOUS | Status: AC
  Administered 2017-07-18: 3.375 g via INTRAVENOUS
  Filled 2017-07-18: qty 50

## 2017-07-18 MED ORDER — SODIUM CHLORIDE 0.9% FLUSH
3.0000 mL | Freq: Two times a day (BID) | INTRAVENOUS | Status: DC
  Administered 2017-07-18 – 2017-07-27 (×18): 3 mL via INTRAVENOUS

## 2017-07-18 MED ORDER — SERTRALINE HCL 100 MG PO TABS
100.0000 mg | ORAL_TABLET | Freq: Every day | ORAL | Status: DC
  Administered 2017-07-18 – 2017-07-26 (×9): 100 mg via ORAL
  Filled 2017-07-18 (×9): qty 1

## 2017-07-18 MED ORDER — SODIUM CHLORIDE 0.9 % IV SOLN: 250.0000 mL | INTRAVENOUS | Status: DC | PRN

## 2017-07-18 MED ORDER — ADULT MULTIVITAMIN W/MINERALS CH
1.0000 | ORAL_TABLET | Freq: Every day | ORAL | Status: DC
  Administered 2017-07-18 – 2017-07-27 (×10): 1 via ORAL
  Filled 2017-07-18 (×11): qty 1

## 2017-07-18 MED ORDER — LORAZEPAM 2 MG/ML IJ SOLN
1.0000 mg | Freq: Once | INTRAMUSCULAR | Status: AC
Start: 2017-07-18 — End: 2017-07-18
  Administered 2017-07-18: 1 mg via INTRAVENOUS
  Filled 2017-07-18: qty 1

## 2017-07-18 MED ORDER — ACETAMINOPHEN 325 MG PO TABS
650.0000 mg | ORAL_TABLET | Freq: Once | ORAL | Status: AC
  Administered 2017-07-18: 650 mg via ORAL
  Filled 2017-07-18: qty 2

## 2017-07-18 MED ORDER — FOLIC ACID 1 MG PO TABS
500.0000 ug | ORAL_TABLET | Freq: Every day | ORAL | Status: DC
  Administered 2017-07-18 – 2017-07-27 (×10): 0.5 mg via ORAL
  Filled 2017-07-18 (×10): qty 1

## 2017-07-18 MED ORDER — ACETAMINOPHEN 325 MG PO TABS: 650.0000 mg | ORAL_TABLET | Freq: Four times a day (QID) | ORAL | Status: DC | PRN

## 2017-07-18 MED ORDER — ATORVASTATIN CALCIUM 20 MG PO TABS
20.0000 mg | ORAL_TABLET | Freq: Every day | ORAL | Status: DC
  Administered 2017-07-18 – 2017-07-26 (×9): 20 mg via ORAL
  Filled 2017-07-18 (×9): qty 1

## 2017-07-18 MED ORDER — HEPARIN SODIUM (PORCINE) 5000 UNIT/ML IJ SOLN
5000.0000 [IU] | Freq: Three times a day (TID) | INTRAMUSCULAR | Status: DC
  Administered 2017-07-18 – 2017-07-21 (×10): 5000 [IU] via SUBCUTANEOUS
  Filled 2017-07-18 (×10): qty 1

## 2017-07-18 MED ORDER — ACETAMINOPHEN 650 MG RE SUPP: 650.0000 mg | Freq: Four times a day (QID) | RECTAL | Status: DC | PRN

## 2017-07-18 NOTE — Progress Notes (Signed)
  Your patient is on amlodipine  daily and Simvastatin  daily at bedtime.  On these prior to admission.   Patients on amlodipine and simvastatin >20mg /day have reported cases of rhabdomyolysis. Clinically equivalent dose of Lipitor  has been substituted for Zocor   per [facility] P&T committee policy.  Noah Delaine, RPh Clinical Pharmacist Pager: [phone] 07/18/2017 4:29 PM

## 2017-07-18 NOTE — Progress Notes (Signed)
Pt arrived to 5W via stretcher from the ED around 0250, accompanied by daughter, Melody. Pt was slid over from the stretcher to the bed. Pt oriented to room/call light/unit rules. Pt's white bracelet is on with correct identifiers, blue bracelet present as well. This RN applied a yellow fall risk bracelet d/t hx of falls within the last 6 months. Skin is intact, however bottom is red but blanchable, skin assessment completed with Boneta Lucks, RN. RLE cellulitis present, red/warm to touch-elevated on pillows. Call light within reach, bed alarm on for safety. Will ctm.
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
## 2017-07-18 NOTE — Progress Notes (Signed)
*  PRELIMINARY RESULTS* Vascular Ultrasound Bilateral lower extremity venous duplex has been completed.  Preliminary findings: No evidence of deep vein thrombosis in the visualized veins of the lower extremities.  Very difficult exam due to limitations of penetration and mobility of the patient.     Chauncey Fischer 07/18/2017, 10:42 AM

## 2017-07-18 NOTE — Progress Notes (Signed)
Pt acting erratic during exam. Pt pushed emergency shut down button while scanning. Unable to finish contrast portion of exam.

## 2017-07-18 NOTE — ED Notes (Signed)
Attempted report x1, RN unavailable @ this time. Callback # left, awaiting callback.
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
## 2017-07-18 NOTE — Progress Notes (Signed)
Pharmacy Antibiotic Note  Hailey Ortiz is a 66 y.o. female admitted on 07/17/2017 with cellulitis.  Pharmacy has been consulted for Vancomycin/Zosyn dosing. WBC WNL. Renal function ok.   Plan Vancomycin 1000 mg IV q12h Zosyn 3.375G IV q8h to be infused over 4 hours Trend WBC, temp, renal function  F/U infectious work-up Drug levels as indicated   Height:  (175.3 cm) Weight: 197 lb 1.5 oz (89.4 kg) IBW/kg (Calculated) : 66.2  Temp (24hrs), Avg:98.9 F (37.2 C), Min:98.7 F (37.1 C), Max:99 F (37.2 C)   Recent Labs Lab 07/17/17 2124 07/17/17 2151 07/18/17 0047  WBC 7.3  --   --   CREATININE 0.75  --   --   LATICACIDVEN  --  0.74 1.26    Estimated Creatinine Clearance: 83.6 mL/min (by C-G formula based on SCr of 0.75 mg/dL).    Allergies  Allergen Reactions  . Furosemide Nausea And Vomiting and Other (See Comments)    Passes out   . Lisinopril     She does not want to take it     Abran Duke 07/18/2017 3:12 AM
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
## 2017-07-18 NOTE — Progress Notes (Signed)
  Echocardiogram 2D Echocardiogram has been performed.  Hailey Ortiz [name] 07/18/2017, 11:12 AM

## 2017-07-18 NOTE — Progress Notes (Signed)
  Echocardiogram 2D Echocardiogram has been performed.  Hailey Ortiz T Hailey Ortiz 07/18/2017, 11:13 AM

## 2017-07-19 ENCOUNTER — Inpatient Hospital Stay (HOSPITAL_COMMUNITY): Payer: Medicare Other

## 2017-07-19 DIAGNOSIS — L03213 Periorbital cellulitis: Secondary | ICD-10-CM

## 2017-07-19 LAB — TYPE AND SCREEN
ABO/RH(D): A POS
ANTIBODY SCREEN: NEGATIVE
UNIT DIVISION: 0
Unit division: 0

## 2017-07-19 LAB — BPAM RBC
Blood Product Expiration Date: 201811012359
Blood Product Expiration Date: 201811012359
ISSUE DATE / TIME: 201810121246
ISSUE DATE / TIME: 201810121526
UNIT TYPE AND RH: 6200
Unit Type and Rh: 6200

## 2017-07-19 MED ORDER — GADOBENATE DIMEGLUMINE 529 MG/ML IV SOLN
19.0000 mL | Freq: Once | INTRAVENOUS | Status: AC | PRN
  Administered 2017-07-19: 19 mL via INTRAVENOUS

## 2017-07-19 NOTE — Progress Notes (Signed)
Scan reports reviewed Recommend interventional radiology guided aspiration of the fluid collection around the right knee I will also ask Dr. Lajoyce Corners to evaluate venous stasis in the right lower extremity
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
## 2017-07-20 DIAGNOSIS — M25561 Pain in right knee: Secondary | ICD-10-CM

## 2017-07-20 LAB — COMPREHENSIVE METABOLIC PANEL
ALBUMIN: 2.7 g/dL — AB (ref 3.5–5.0)
ALK PHOS: 53 U/L (ref 38–126)
ALT: 7 U/L — AB (ref 14–54)
ANION GAP: 7 (ref 5–15)
AST: 12 U/L — AB (ref 15–41)
BUN: 17 mg/dL (ref 6–20)
CALCIUM: 8.1 mg/dL — AB (ref 8.9–10.3)
CO2: 26 mmol/L (ref 22–32)
CREATININE: 0.79 mg/dL (ref 0.44–1.00)
Chloride: 105 mmol/L (ref 101–111)
GFR calc Af Amer: >60 mL/min (ref >60)
GFR calc non Af Amer: >60 mL/min (ref >60)
GLUCOSE: 92 mg/dL (ref 65–99)
Potassium: 3.8 mmol/L (ref 3.5–5.1)
SODIUM: 138 mmol/L (ref 135–145)
Total Bilirubin: 0.9 mg/dL (ref 0.3–1.2)
Total Protein: 5.6 g/dL — ABNORMAL LOW (ref 6.5–8.1)

## 2017-07-20 LAB — CBC WITH DIFFERENTIAL/PLATELET
Basophils Absolute: 0 10*3/uL (ref 0.0–0.1)
Basophils Relative: 0 %
EOS PCT: 2 %
Eosinophils Absolute: 0.1 10*3/uL (ref 0.0–0.7)
HCT: 28.9 % — ABNORMAL LOW (ref 36.0–46.0)
Hemoglobin: 8.7 g/dL — ABNORMAL LOW (ref 12.0–15.0)
LYMPHS ABS: 1.3 10*3/uL (ref 0.7–4.0)
LYMPHS PCT: 24 %
MCH: 27.8 pg (ref 26.0–34.0)
MCHC: 30.1 g/dL (ref 30.0–36.0)
MCV: 92.3 fL (ref 78.0–100.0)
MONO ABS: 0.5 10*3/uL (ref 0.1–1.0)
MONOS PCT: 9 %
NEUTROS ABS: 3.6 10*3/uL (ref 1.7–7.7)
Neutrophils Relative %: 65 %
Platelets: 239 10*3/uL (ref 150–400)
RBC: 3.13 MIL/uL — ABNORMAL LOW (ref 3.87–5.11)
RDW: 16.1 % — ABNORMAL HIGH (ref 11.5–15.5)
WBC: 5.5 10*3/uL (ref 4.0–10.5)

## 2017-07-20 LAB — URINE CULTURE: Culture: >=100000 — AB
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
## 2017-07-20 NOTE — Progress Notes (Signed)
It is important to note that it is not the knee joint that we desire to have aspirated.  There is a significant fluid collection around the knee that requires aspiration with likely drain placement.  The knee joint itself does not require aspiration

## 2017-07-20 NOTE — Progress Notes (Signed)
IR consult for right knee joint aspiration noted.  Have placed order for fluoroscopy-guided procedure.  Anticipate procedure tomorrow as schedule allows and once imaging reviewed by MD.   Loyce Dys, MS RD PA-C 8:50 AM

## 2017-07-21 ENCOUNTER — Other Ambulatory Visit (INDEPENDENT_AMBULATORY_CARE_PROVIDER_SITE_OTHER): Payer: Self-pay | Admitting: Family

## 2017-07-21 ENCOUNTER — Inpatient Hospital Stay (HOSPITAL_COMMUNITY): Payer: Medicare Other

## 2017-07-21 DIAGNOSIS — S8011XS Contusion of right lower leg, sequela: Secondary | ICD-10-CM

## 2017-07-21 DIAGNOSIS — T148XXA Other injury of unspecified body region, initial encounter: Secondary | ICD-10-CM

## 2017-07-21 MED ORDER — HEPARIN SODIUM (PORCINE) 5000 UNIT/ML IJ SOLN
5000.0000 [IU] | Freq: Three times a day (TID) | INTRAMUSCULAR | Status: DC
  Administered 2017-07-21 – 2017-07-27 (×16): 5000 [IU] via SUBCUTANEOUS
  Filled 2017-07-21 (×16): qty 1

## 2017-07-21 MED ORDER — VANCOMYCIN HCL IN DEXTROSE 1-5 GM/200ML-% IV SOLN
1000.0000 mg | Freq: Two times a day (BID) | INTRAVENOUS | Status: DC
Start: 2017-07-21 — End: 2017-07-22
  Administered 2017-07-21 – 2017-07-22 (×2): 1000 mg via INTRAVENOUS
  Filled 2017-07-21 (×2): qty 200

## 2017-07-21 MED ORDER — LIDOCAINE HCL (PF) 1 % IJ SOLN
INTRAMUSCULAR | Status: AC
  Filled 2017-07-21: qty 30

## 2017-07-21 NOTE — Procedures (Signed)
R leg aspiration times two Bloody fluid times two EBL 0 Comp 0

## 2017-07-21 NOTE — Progress Notes (Signed)
Per MD, to move metoprolol to 1400 d/t BP 111/55 mm/Hg

## 2017-07-21 NOTE — Progress Notes (Signed)
Will plan for aspiration of this fluid collection today in [place].  May continue her diet as this will be done without sedation.  D/W Dr. Lajoyce Corners.  Hailey Ortiz E 9:12 AM 07/21/2017
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
## 2017-07-22 LAB — CULTURE, BLOOD (ROUTINE X 2)
Culture: NO GROWTH
Culture: NO GROWTH
SPECIAL REQUESTS: ADEQUATE
Special Requests: ADEQUATE

## 2017-07-22 MED ORDER — HYDROXYZINE HCL 10 MG PO TABS: 10.0000 mg | ORAL_TABLET | Freq: Four times a day (QID) | ORAL | Status: DC | PRN

## 2017-07-22 MED ORDER — CEFAZOLIN SODIUM-DEXTROSE 2-4 GM/100ML-% IV SOLN
2.0000 g | INTRAVENOUS | Status: AC
  Administered 2017-07-23: 2 g via INTRAVENOUS
  Filled 2017-07-22 (×2): qty 100

## 2017-07-22 MED ORDER — HYDROXYZINE HCL 10 MG PO TABS
10.0000 mg | ORAL_TABLET | Freq: Three times a day (TID) | ORAL | Status: DC
  Administered 2017-07-22 – 2017-07-27 (×14): 10 mg via ORAL
  Filled 2017-07-22 (×18): qty 1

## 2017-07-22 MED ORDER — CHLORHEXIDINE GLUCONATE 4 % EX LIQD
60.0000 mL | Freq: Once | CUTANEOUS | Status: AC
  Administered 2017-07-23: 4 via TOPICAL
  Filled 2017-07-22 (×2): qty 60

## 2017-07-22 NOTE — Progress Notes (Signed)
Patient ID: Hailey Ortiz, female   DOB: 03-06-1951, 66 y.o.   MRN: [id number] Patient had interventional radiology do a needle aspirate of the hematoma. Patient without complaints this morning, she states don't cut  my leg off.  Plan for irrigation and debridement of right knee wound  tomorrow. Patient may require multiple surgical interventions depending on the viability of the deep soft tissue.
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
## 2017-07-23 ENCOUNTER — Encounter (HOSPITAL_COMMUNITY): Payer: Self-pay | Admitting: Orthopedic Surgery

## 2017-07-23 ENCOUNTER — Encounter (HOSPITAL_COMMUNITY)
Admission: EM | Disposition: A | Discharge status: Home / Self Care | Payer: Self-pay | Source: Home / Self Care | Attending: Family Medicine

## 2017-07-23 ENCOUNTER — Inpatient Hospital Stay (HOSPITAL_COMMUNITY): Payer: Medicare Other | Admitting: Certified Registered Nurse Anesthetist

## 2017-07-23 DIAGNOSIS — M2540 Effusion, unspecified joint: Secondary | ICD-10-CM

## 2017-07-23 DIAGNOSIS — L02419 Cutaneous abscess of limb, unspecified: Secondary | ICD-10-CM

## 2017-07-23 HISTORY — PX: I & D EXTREMITY: SHX5045

## 2017-07-23 LAB — BASIC METABOLIC PANEL
ANION GAP: 7 (ref 5–15)
BUN: 17 mg/dL (ref 6–20)
CHLORIDE: 104 mmol/L (ref 101–111)
CO2: 28 mmol/L (ref 22–32)
Calcium: 8.6 mg/dL — ABNORMAL LOW (ref 8.9–10.3)
Creatinine, Ser: 0.77 mg/dL (ref 0.44–1.00)
GFR calc Af Amer: >60 mL/min (ref >60)
GFR calc non Af Amer: >60 mL/min (ref >60)
GLUCOSE: 91 mg/dL (ref 65–99)
POTASSIUM: 4 mmol/L (ref 3.5–5.1)
Sodium: 139 mmol/L (ref 135–145)

## 2017-07-23 LAB — CBC
HEMATOCRIT: 32.9 % — AB (ref 36.0–46.0)
HEMOGLOBIN: 9.7 g/dL — AB (ref 12.0–15.0)
MCH: 27.6 pg (ref 26.0–34.0)
MCHC: 29.5 g/dL — ABNORMAL LOW (ref 30.0–36.0)
MCV: 93.7 fL (ref 78.0–100.0)
Platelets: 256 10*3/uL (ref 150–400)
RBC: 3.51 MIL/uL — AB (ref 3.87–5.11)
RDW: 15.4 % (ref 11.5–15.5)
WBC: 5.3 10*3/uL (ref 4.0–10.5)

## 2017-07-23 LAB — SURGICAL PCR SCREEN
MRSA, PCR: NEGATIVE
Staphylococcus aureus: NEGATIVE

## 2017-07-23 SURGERY — IRRIGATION AND DEBRIDEMENT EXTREMITY
Anesthesia: General | Site: Knee | Laterality: Right

## 2017-07-23 MED ORDER — LACTATED RINGERS IV SOLN
INTRAVENOUS | Status: DC
  Administered 2017-07-23: 11:00:00 via INTRAVENOUS

## 2017-07-23 MED ORDER — ONDANSETRON HCL 4 MG PO TABS: 4.0000 mg | ORAL_TABLET | Freq: Four times a day (QID) | ORAL | Status: DC | PRN

## 2017-07-23 MED ORDER — DEXAMETHASONE SODIUM PHOSPHATE 10 MG/ML IJ SOLN
INTRAMUSCULAR | Status: AC
  Filled 2017-07-23: qty 1

## 2017-07-23 MED ORDER — 0.9 % SODIUM CHLORIDE (POUR BTL) OPTIME
TOPICAL | Status: DC | PRN
  Administered 2017-07-23: 1000 mL

## 2017-07-23 MED ORDER — HYDROMORPHONE HCL 1 MG/ML IJ SOLN: 0.2500 mg | INTRAMUSCULAR | Status: DC | PRN

## 2017-07-23 MED ORDER — ONDANSETRON HCL 4 MG/2ML IJ SOLN
INTRAMUSCULAR | Status: AC
  Filled 2017-07-23: qty 2

## 2017-07-23 MED ORDER — ACETAMINOPHEN 325 MG PO TABS: 650.0000 mg | ORAL_TABLET | Freq: Four times a day (QID) | ORAL | Status: DC | PRN

## 2017-07-23 MED ORDER — BISACODYL 10 MG RE SUPP
10.0000 mg | Freq: Every day | RECTAL | Status: DC | PRN
  Filled 2017-07-23: qty 1

## 2017-07-23 MED ORDER — POLYETHYLENE GLYCOL 3350 17 G PO PACK: 17.0000 g | PACK | Freq: Every day | ORAL | Status: DC | PRN

## 2017-07-23 MED ORDER — HYDROMORPHONE HCL 1 MG/ML IJ SOLN: 1.0000 mg | INTRAMUSCULAR | Status: DC | PRN

## 2017-07-23 MED ORDER — SODIUM CHLORIDE 0.9 % IR SOLN
Status: DC | PRN
  Administered 2017-07-23: 3000 mL

## 2017-07-23 MED ORDER — DOCUSATE SODIUM 100 MG PO CAPS
100.0000 mg | ORAL_CAPSULE | Freq: Two times a day (BID) | ORAL | Status: DC
  Administered 2017-07-23 – 2017-07-24 (×4): 100 mg via ORAL
  Filled 2017-07-23 (×4): qty 1

## 2017-07-23 MED ORDER — ONDANSETRON HCL 4 MG/2ML IJ SOLN: 4.0000 mg | Freq: Four times a day (QID) | INTRAMUSCULAR | Status: DC | PRN

## 2017-07-23 MED ORDER — FENTANYL CITRATE (PF) 250 MCG/5ML IJ SOLN
INTRAMUSCULAR | Status: AC
  Filled 2017-07-23: qty 5

## 2017-07-23 MED ORDER — MIDAZOLAM HCL 2 MG/2ML IJ SOLN
INTRAMUSCULAR | Status: AC
Start: 2017-07-23 — End: 2017-07-23
  Filled 2017-07-23: qty 2

## 2017-07-23 MED ORDER — METOCLOPRAMIDE HCL 5 MG/ML IJ SOLN: 5.0000 mg | Freq: Three times a day (TID) | INTRAMUSCULAR | Status: DC | PRN

## 2017-07-23 MED ORDER — LIDOCAINE 2% (20 MG/ML) 5 ML SYRINGE
INTRAMUSCULAR | Status: DC | PRN
  Administered 2017-07-23: 60 mg via INTRAVENOUS

## 2017-07-23 MED ORDER — ACETAMINOPHEN 650 MG RE SUPP: 650.0000 mg | Freq: Four times a day (QID) | RECTAL | Status: DC | PRN

## 2017-07-23 MED ORDER — FENTANYL CITRATE (PF) 100 MCG/2ML IJ SOLN
INTRAMUSCULAR | Status: DC | PRN
  Administered 2017-07-23 (×2): 50 ug via INTRAVENOUS
  Administered 2017-07-23: 25 ug via INTRAVENOUS

## 2017-07-23 MED ORDER — PROPOFOL 10 MG/ML IV BOLUS
INTRAVENOUS | Status: DC | PRN
  Administered 2017-07-23: 60 mg via INTRAVENOUS

## 2017-07-23 MED ORDER — CEFAZOLIN SODIUM-DEXTROSE 2-4 GM/100ML-% IV SOLN
2.0000 g | Freq: Four times a day (QID) | INTRAVENOUS | Status: AC
  Administered 2017-07-23 – 2017-07-24 (×2): 2 g via INTRAVENOUS
  Filled 2017-07-23 (×2): qty 100

## 2017-07-23 MED ORDER — OXYCODONE HCL 5 MG PO TABS: 5.0000 mg | ORAL_TABLET | Freq: Once | ORAL | Status: DC | PRN

## 2017-07-23 MED ORDER — METOCLOPRAMIDE HCL 5 MG PO TABS
5.0000 mg | ORAL_TABLET | Freq: Three times a day (TID) | ORAL | Status: DC | PRN
  Filled 2017-07-23: qty 2

## 2017-07-23 MED ORDER — MAGNESIUM CITRATE PO SOLN: 1.0000 | Freq: Once | ORAL | Status: DC | PRN

## 2017-07-23 MED ORDER — METHOCARBAMOL 1000 MG/10ML IJ SOLN: 500.0000 mg | Freq: Four times a day (QID) | INTRAVENOUS | Status: DC | PRN

## 2017-07-23 MED ORDER — SODIUM CHLORIDE 0.9 % IV SOLN: INTRAVENOUS | Status: DC

## 2017-07-23 MED ORDER — EPHEDRINE SULFATE-NACL 50-0.9 MG/10ML-% IV SOSY
PREFILLED_SYRINGE | INTRAVENOUS | Status: DC | PRN
  Administered 2017-07-23: 10 mg via INTRAVENOUS

## 2017-07-23 MED ORDER — METHOCARBAMOL 500 MG PO TABS: 500.0000 mg | ORAL_TABLET | Freq: Four times a day (QID) | ORAL | Status: DC | PRN

## 2017-07-23 MED ORDER — OXYCODONE HCL 5 MG/5ML PO SOLN: 5.0000 mg | Freq: Once | ORAL | Status: DC | PRN

## 2017-07-23 MED ORDER — ONDANSETRON HCL 4 MG/2ML IJ SOLN
INTRAMUSCULAR | Status: DC | PRN
  Administered 2017-07-23: 4 mg via INTRAVENOUS

## 2017-07-23 MED ORDER — EPHEDRINE 5 MG/ML INJ
INTRAVENOUS | Status: AC
  Filled 2017-07-23: qty 10

## 2017-07-23 MED ORDER — OXYCODONE HCL 5 MG PO TABS: 5.0000 mg | ORAL_TABLET | ORAL | Status: DC | PRN

## 2017-07-23 MED ORDER — DEXAMETHASONE SODIUM PHOSPHATE 10 MG/ML IJ SOLN
INTRAMUSCULAR | Status: DC | PRN
  Administered 2017-07-23: 5 mg via INTRAVENOUS

## 2017-07-23 MED ORDER — PROPOFOL 10 MG/ML IV BOLUS
INTRAVENOUS | Status: AC
  Filled 2017-07-23: qty 20

## 2017-07-23 SURGICAL SUPPLY — 34 items
BLADE SURG 21 STRL SS (BLADE) ×3 IMPLANT
BNDG COHESIVE 6X5 TAN STRL LF (GAUZE/BANDAGES/DRESSINGS) ×6 IMPLANT
BNDG GAUZE ELAST 4 BULKY (GAUZE/BANDAGES/DRESSINGS) IMPLANT
CANISTER WOUND CARE 500ML ATS (WOUND CARE) ×3 IMPLANT
COVER SURGICAL LIGHT HANDLE (MISCELLANEOUS) ×3 IMPLANT
DRAPE INCISE IOBAN 66X45 STRL (DRAPES) ×3 IMPLANT
DRAPE U-SHAPE 47X51 STRL (DRAPES) ×3 IMPLANT
DRESSING PREVENA PLUS CUSTOM (GAUZE/BANDAGES/DRESSINGS) ×1 IMPLANT
DRSG ADAPTIC 3X8 NADH LF (GAUZE/BANDAGES/DRESSINGS) IMPLANT
DRSG PREVENA PLUS CUSTOM (GAUZE/BANDAGES/DRESSINGS) ×3
DURAPREP 26ML APPLICATOR (WOUND CARE) ×3 IMPLANT
ELECT REM PT RETURN 9FT ADLT (ELECTROSURGICAL) ×3
ELECTRODE REM PT RTRN 9FT ADLT (ELECTROSURGICAL) ×1 IMPLANT
GAUZE SPONGE 4X4 12PLY STRL (GAUZE/BANDAGES/DRESSINGS) ×3 IMPLANT
GLOVE BIOGEL PI IND STRL 9 (GLOVE) ×1 IMPLANT
GLOVE BIOGEL PI INDICATOR 9 (GLOVE) ×2
GLOVE SURG ORTHO 9.0 STRL STRW (GLOVE) ×3 IMPLANT
GOWN STRL REUS W/ TWL XL LVL3 (GOWN DISPOSABLE) ×2 IMPLANT
GOWN STRL REUS W/TWL XL LVL3 (GOWN DISPOSABLE) ×4
HANDPIECE INTERPULSE COAX TIP (DISPOSABLE) ×2
KIT BASIN OR (CUSTOM PROCEDURE TRAY) ×3 IMPLANT
KIT ROOM TURNOVER OR (KITS) ×3 IMPLANT
MANIFOLD NEPTUNE II (INSTRUMENTS) ×3 IMPLANT
NS IRRIG 1000ML POUR BTL (IV SOLUTION) ×3 IMPLANT
PACK ORTHO EXTREMITY (CUSTOM PROCEDURE TRAY) ×3 IMPLANT
PAD ARMBOARD 7.5X6 YLW CONV (MISCELLANEOUS) ×3 IMPLANT
PADDING CAST COTTON 6X4 STRL (CAST SUPPLIES) ×3 IMPLANT
SET HNDPC FAN SPRY TIP SCT (DISPOSABLE) ×1 IMPLANT
STOCKINETTE IMPERVIOUS 9X36 MD (GAUZE/BANDAGES/DRESSINGS) ×3 IMPLANT
SUT ETHILON 2 0 PSLX (SUTURE) ×9 IMPLANT
TOWEL OR 17X26 10 PK STRL BLUE (TOWEL DISPOSABLE) ×3 IMPLANT
TUBE CONNECTING 12'X1/4 (SUCTIONS) ×1
TUBE CONNECTING 12X1/4 (SUCTIONS) ×2 IMPLANT
YANKAUER SUCT BULB TIP NO VENT (SUCTIONS) ×3 IMPLANT

## 2017-07-23 NOTE — Op Note (Signed)
07/17/2017 - 07/23/2017  12:06 PM  PATIENT:  Hailey Ortiz    PRE-OPERATIVE DIAGNOSIS:  hematoma right knee  POST-OPERATIVE DIAGNOSIS:  Same  PROCEDURE:  DEBRIDEMENT RIGHT KNEE WOUND Excision of skin soft tissue muscle fascia and fat. Local tissue rearrangement for wound closure 20 x 15 cm. Application of Prevena wound VAC  SURGEON:  Nadara MustardMarcus V Caydon Feasel, MD  PHYSICIAN ASSISTANT:None ANESTHESIA:   General  PREOPERATIVE INDICATIONS:  Hailey Seedsheresa Yurkovich is a  66 y.o. female with a diagnosis of hematoma right knee who failed conservative measures and elected for surgical management.    The risks benefits and alternatives were discussed with the patient preoperatively including but not limited to the risks of infection, bleeding, nerve injury, cardiopulmonary complications, the need for revision surgery, among others, and the patient was willing to proceed.  OPERATIVE IMPLANTS: Prevena wound VAC  OPERATIVE FINDINGS: large hematoma with necrotic fat soft tissue.  OPERATIVE PROCEDURE: patient was brought to the operating room and underwent a general anesthetic. After adequate levels anesthesia were obtained patient's right lower extremity was prepped using DuraPrep draped into a sterile field a timeout was called. A longitudinal incision was made over the hematoma anterior medial aspect of the right knee. A large hematoma was encountered there was a large amount of necrotic fat soft tissue and skin. A 21 blade knife was used to sharply excise the skin and soft tissue injury was used to further debridement necrotic fat and soft tissue. Electrocautery was used for hemostasis. The wound was irrigated with pulsatile lavage. Local tissue rearrangement was used to close the wound 15 x 20 cm and 6 cm deep. A Prevena wound VAC was applied this had a good suction fit. Patient was then also placed. A compression wrap from her toes to the knee. Patient had pending venous stasis ulcerations from the massive venous  stasis swelling in her right leg.  Plan return the operating room on Friday for repeat irrigation debridement and anticipate wound closure at follow-up.

## 2017-07-23 NOTE — Anesthesia Preprocedure Evaluation (Signed)
Anesthesia Evaluation  Patient identified by MRN, date of birth, ID band Patient awake    Reviewed: Allergy & Precautions, NPO status , Patient's Chart, lab work & pertinent test results  Airway Mallampati: II   Neck ROM: Full    Dental no notable dental hx.    Pulmonary neg pulmonary ROS,    breath sounds clear to auscultation       Cardiovascular hypertension,  Rhythm:Regular Rate:Normal     Neuro/Psych  Headaches,    GI/Hepatic   Endo/Other    Renal/GU Renal InsufficiencyRenal disease     Musculoskeletal   Abdominal   Peds  Hematology  (+) anemia ,   Anesthesia Other Findings   Reproductive/Obstetrics                             Anesthesia Physical Anesthesia Plan  ASA: II  Anesthesia Plan: General   Post-op Pain Management:    Induction:   PONV Risk Score and Plan: 4 or greater and Ondansetron, Dexamethasone, Midazolam, Scopolamine patch - Pre-op, Propofol infusion and Treatment may vary due to age or medical condition  Airway Management Planned: LMA  Additional Equipment:   Intra-op Plan:   Post-operative Plan: Extubation in OR  Informed Consent: I have reviewed the patients History and Physical, chart, labs and discussed the procedure including the risks, benefits and alternatives for the proposed anesthesia with the patient or authorized representative who has indicated his/her understanding and acceptance.   Dental advisory given  Plan Discussed with: CRNA  Anesthesia Plan Comments:         Anesthesia Quick Evaluation

## 2017-07-23 NOTE — Transfer of Care (Signed)
Immediate Anesthesia Transfer of Care Note  Patient: Rogers Seedsheresa Ostrander  Procedure(s) Performed: DEBRIDEMENT RIGHT KNEE WOUND (Right Knee)  Patient Location: PACU  Anesthesia Type:General  Level of Consciousness: drowsy and patient cooperative  Airway & Oxygen Therapy: Patient Spontanous Breathing, connected to nasal cannula  Post-op Assessment: Report given to RN and Post -op Vital signs reviewed and stable  Post vital signs: Reviewed and stable  Last Vitals:  Vitals:   07/23/17 0707 07/23/17 1219  BP: (!) 107/58 130/65  Pulse: 68 66  Resp: 17   Temp: 36.7 C 36.5 C  SpO2: 100% 100%    Last Pain:  Vitals:   07/23/17 1219  TempSrc:   PainSc: 0-No pain         Complications: No apparent anesthesia complications

## 2017-07-23 NOTE — Interval H&P Note (Signed)
History and Physical Interval Note:  07/23/2017 7:58 AM  Hailey Ortiz  has presented today for surgery, with the diagnosis of hematoma right knee  The various methods of treatment have been discussed with the patient and family. After consideration of risks, benefits and other options for treatment, the patient has consented to  Procedure(s): DEBRIDEMENT RIGHT KNEE WOUND (Right) as a surgical intervention .  The patient's history has been reviewed, patient examined, no change in status, stable for surgery.  I have reviewed the patient's chart and labs.  Questions were answered to the patient's satisfaction.     Nadara MustardMarcus V Shritha Bresee

## 2017-07-23 NOTE — Progress Notes (Signed)
CM received consult :  Equipment     Home health needs    Medication needs    Other (see comments)       Comments   PT / OT / SLP DME as needed   PT evaluation pending, CM to f/u with disposition needs. Hager Compston RN, BSN,CM 
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
## 2017-07-23 NOTE — Evaluation (Signed)
Physical Therapy Evaluation Patient Details Name: Hailey Ortiz MRN: [id number] DOB: 1950-12-06 Today's Date: 07/23/2017   History of Present Illness  Pt is a 66 y/o female who presents s/p debridement of R knee and placement of wound VAC 10/17. Pt to return to OR 10/19 for repeat I&D and anticipated wound closure.  Clinical Impression  Pt admitted with above diagnosis. Pt currently with functional limitations due to the deficits listed below (see PT Problem List). At the time of PT eval pt was able to perform transfers and ambulation with gross min guard assist for balance support and safety with RW use. Overall pt moving very well with minimal reported pain. Noted decreased cognition throughout evaluation and family reports this is baseline. Supportive husband available 24 hours at d/c. Pt will benefit from skilled PT to increase their independence and safety with mobility to allow discharge to the venue listed below.      Follow Up Recommendations No PT follow up;Supervision for mobility/OOB    Equipment Recommendations  None recommended by PT    Recommendations for Other Services       Precautions / Restrictions Precautions Precautions: Fall Precaution Comments: Wound VAC RLE Restrictions Weight Bearing Restrictions: No      Mobility  Bed Mobility Overal bed mobility: Needs Assistance Bed Mobility: Sit to Supine;Supine to Sit     Supine to sit: Supervision Sit to supine: Min guard   General bed mobility comments: Min guard for LE elevation back into bed at end of session. Pt was able to transition to EOB without difficulty.   Transfers Overall transfer level: Needs assistance Equipment used: Rolling walker (2 wheeled) Transfers: Sit to/from Stand Sit to Stand: Min guard         General transfer comment: Close guard for safety as pt powered-up to full standing position. [facility]   Ambulation/Gait Ambulation/Gait assistance: Min  guard Ambulation Distance (Feet): 400 Feet Assistive device: Rolling walker (2 wheeled) Gait Pattern/deviations: Step-through pattern;Decreased stride length;Trunk flexed Gait velocity: Decreased Gait velocity interpretation: Below normal speed for age/gender General Gait Details: VC's for closer walker proximity and improved posture. Pt made minimal corrective changes and was unable to maintain the corrections that she did make. No SOB or unsteadiness noted.   Stairs            Wheelchair Mobility    Modified Rankin (Stroke Patients Only)       Balance Overall balance assessment: Needs assistance Sitting-balance support: Feet supported;No upper extremity supported Sitting balance-Leahy Scale: Good     Standing balance support: No upper extremity supported;During functional activity Standing balance-Leahy Scale: Fair Standing balance comment: static standing balance                             Pertinent Vitals/Pain Pain Assessment: Faces Faces Pain Scale: Hurts a little bit Pain Location: R knee.  Pain Descriptors / Indicators: Operative site guarding Pain Intervention(s): Limited activity within patient's tolerance;Monitored during session;Repositioned    Home Living Family/patient expects to be discharged to:: Private residence Living Arrangements: Spouse/significant other Available Help at Discharge: Family;Available 24 hours/day Type of Home: House Home Access: Stairs to enter Entrance Stairs-Rails: None Entrance Stairs-Number of Steps: 2 Home Layout: One level Home Equipment: Walker - 2 wheels;Shower seat      Prior Function Level of Independence: Needs assistance   Gait / Transfers Assistance Needed: Pt reports using the RW at all  times PTA  ADL's / Homemaking Assistance Needed: Pt reports requiring assistance from husband for ADL's  Comments: No able to confirm PLOF with family.      Hand Dominance        Extremity/Trunk Assessment    Upper Extremity Assessment Upper Extremity Assessment: Defer to OT evaluation    Lower Extremity Assessment Lower Extremity Assessment: RLE deficits/detail RLE Deficits / Details: Noted significant edema in the RLE. Pt reports pain but did not appear to be limited in mobility by pain.     Cervical / Trunk Assessment Cervical / Trunk Assessment: Other exceptions Cervical / Trunk Exceptions: Forward head/rounded shoulder posture  Communication   Communication: Expressive difficulties  Cognition Arousal/Alertness: Awake/alert Behavior During Therapy: Flat affect Overall Cognitive Status: History of cognitive impairments - at baseline                                 General Comments: Family reports possible undiagnosed dementia. Noted slow processing throughout session. Pt followed commands well however required very specific cues to complete a task. Pt often answered questions with 1-2 word answers, and at times did not respond at all.       General Comments      Exercises     Assessment/Plan    PT Assessment Patient needs continued PT services  PT Problem List Decreased strength;Decreased range of motion;Decreased activity tolerance;Decreased balance;Decreased mobility;Decreased knowledge of use of DME;Decreased safety awareness;Decreased cognition;Decreased knowledge of precautions;Pain       PT Treatment Interventions DME instruction;Gait training;Functional mobility training;Stair training;Therapeutic activities;Therapeutic exercise;Neuromuscular re-education;Patient/family education;Cognitive remediation    PT Goals (Current goals can be found in the Care Plan section)  Acute Rehab PT Goals Patient Stated Goal: Pt did not state goals PT Goal Formulation: With patient Time For Goal Achievement: 07/30/17 Potential to Achieve Goals: Good    Frequency Min 3X/week   Barriers to discharge        Co-evaluation               AM-PAC PT "6 Clicks"  Daily Activity  Outcome Measure Difficulty turning over in bed (including adjusting bedclothes, sheets and blankets)?: None Difficulty moving from lying on back to sitting on the side of the bed? : None Difficulty sitting down on and standing up from a chair with arms (e.g., wheelchair, bedside commode, etc,.)?: A Little Help needed moving to and from a bed to chair (including a wheelchair)?: A Little Help needed walking in hospital room?: A Little Help needed climbing 3-5 steps with a railing? : A Little 6 Click Score: 20    End of Session Equipment Utilized During Treatment: Gait belt Activity Tolerance: Patient tolerated treatment well Patient left: in bed;with call bell/phone within reach;with bed alarm set;with family/visitor present;with nursing/sitter in room Nurse Communication: Mobility status PT Visit Diagnosis: Pain;Difficulty in walking, not elsewhere classified (R26.2) Pain - Right/Left: Right Pain - part of body: Leg    Time: 1610-9604 PT Time Calculation (min) (ACUTE ONLY): 23 min   Charges:   PT Evaluation $PT Eval Moderate Complexity: 1 Mod PT Treatments $Gait Training: 8-22 mins   PT G Codes:        Conni Slipper, PT, DPT Acute Rehabilitation Services Pager: [phone]   Marylynn Pearson 07/23/2017, 3:27 PM

## 2017-07-23 NOTE — Progress Notes (Signed)
Nutrition Follow-up  DOCUMENTATION CODES:   Not applicable  INTERVENTION:   -Continue 30 ml Prostat BID, each supplement provides 100 kcals and 15 grams protein -Continue MVI daily  NUTRITION DIAGNOSIS:   Increased nutrient needs related to wound healing as evidenced by estimated needs.  Ongoing  GOAL:   Patient will meet greater than or equal to 90% of their needs  Progressing  MONITOR:   PO intake, Supplement acceptance, Labs, Weight trends, Skin, I & O's  REASON FOR ASSESSMENT:   Malnutrition Screening Tool    ASSESSMENT:   Hailey Ortiz is a 66 y.o. female with medical history significant of hypertension, hyperlipidemia, presents to the hospital today with right leg swelling.   10/15- s/p rt leg aspiration x 2  S/p PROCEDURE on 07/23/17:  DEBRIDEMENT RIGHT KNEE WOUND Excision of skin soft tissue muscle fascia and fat. Local tissue rearrangement for wound closure 20 x 15 cm. Application of Prevena wound VAC  Pt down in OR at time of visit. Multiple family members in room.   Pt's appetite continues to improve. Noted meal completion 65-100%. Per pt daughter, has also been consuming outside food/snacks from family.   Per MAR, pt has been taking Prostat and MVI. Will continue supplements, especially with increased needs due to wound vac.   Labs reviewed.   Diet Order:  Diet Carb Modified Fluid consistency: Thin; Room service appropriate? Yes  Skin:  Wound (see comment) (closed rt leg incision w wound vac)  Last BM:  07/22/17  Height:   Ht Readings from Last 1 Encounters:  07/23/17 5\' 9"  (1.753 m)    Weight:   Wt Readings from Last 1 Encounters:  07/23/17 191 lb 12.8 oz (87 kg)    Ideal Body Weight:  65.9 kg  BMI:  Body mass index is 28.32 kg/m.  Estimated Nutritional Needs:   Kcal:  1800-2000  Protein:  115-130 grams  Fluid:  > 1.8 L  EDUCATION NEEDS:   No education needs identified at this time  Hailey Ortiz, RD, LDN,  CDE Pager: [phone] After hours Pager: [phone]

## 2017-07-23 NOTE — H&P (View-Only) (Signed)
Patient ID: Hailey Ortiz, female   DOB: 05-28-1951, 66 y.o.   MRN: [id number] Patient had interventional radiology do a needle aspirate of the hematoma. Patient without complaints this morning, she states don't cut  my leg off.  Plan for irrigation and debridement of right knee wound  tomorrow. Patient may require multiple surgical interventions depending on the viability of the deep soft tissue.

## 2017-07-23 NOTE — Anesthesia Procedure Notes (Signed)
Procedure Name: LMA Insertion Date/Time: 07/23/2017 11:33 AM Performed by: Annabelle HarmanSMITH, Saniyah Mondesir A Pre-anesthesia Checklist: Patient identified, Emergency Drugs available, Suction available and Patient being monitored Patient Re-evaluated:Patient Re-evaluated prior to induction Oxygen Delivery Method: Circle system utilized Preoxygenation: Pre-oxygenation with 100% oxygen Induction Type: IV induction Ventilation: Mask ventilation without difficulty LMA: LMA inserted LMA Size: 4.0 Number of attempts: 1 Placement Confirmation: positive ETCO2 and breath sounds checked- equal and bilateral Tube secured with: Tape Dental Injury: Teeth and Oropharynx as per pre-operative assessment

## 2017-07-24 ENCOUNTER — Encounter (HOSPITAL_COMMUNITY): Payer: Self-pay | Admitting: Orthopedic Surgery

## 2017-07-24 ENCOUNTER — Other Ambulatory Visit (INDEPENDENT_AMBULATORY_CARE_PROVIDER_SITE_OTHER): Payer: Self-pay | Admitting: Family

## 2017-07-24 LAB — CBC
HCT: 29 % — ABNORMAL LOW (ref 36.0–46.0)
HEMOGLOBIN: 8.8 g/dL — AB (ref 12.0–15.0)
MCH: 27.9 pg (ref 26.0–34.0)
MCHC: 30.3 g/dL (ref 30.0–36.0)
MCV: 92.1 fL (ref 78.0–100.0)
Platelets: 275 10*3/uL (ref 150–400)
RBC: 3.15 MIL/uL — AB (ref 3.87–5.11)
RDW: 14.9 % (ref 11.5–15.5)
WBC: 6.7 10*3/uL (ref 4.0–10.5)

## 2017-07-24 NOTE — Care Management Important Message (Signed)
Important Message  Patient Details  Name: Rogers Seedsheresa Faddis MRN: [id number] Date of Birth: [birth year]   Medicare Important Message Given:  Yes    Kyla BalzarineShealy, Jodi Kappes Abena 07/24/2017, 12:21 PM

## 2017-07-24 NOTE — Progress Notes (Signed)
Patient ID: Hailey Ortiz, female   DOB: 01/27/1951, 66 y.o.   MRN: [id number] Postoperative day 1 status post irrigation and debridement for massive hematoma right knee. We will plan for repeat irrigation and debridement and placement of a wound VAC tomorrow Friday.
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
## 2017-07-25 ENCOUNTER — Encounter (HOSPITAL_COMMUNITY)
Admission: EM | Disposition: A | Discharge status: Home / Self Care | Payer: Self-pay | Source: Home / Self Care | Attending: Family Medicine

## 2017-07-25 ENCOUNTER — Inpatient Hospital Stay (HOSPITAL_COMMUNITY): Payer: Medicare Other | Admitting: Anesthesiology

## 2017-07-25 ENCOUNTER — Encounter (HOSPITAL_COMMUNITY): Payer: Self-pay | Admitting: Orthopedic Surgery

## 2017-07-25 HISTORY — PX: I & D EXTREMITY: SHX5045

## 2017-07-25 LAB — CBC
HEMATOCRIT: 28.3 % — AB (ref 36.0–46.0)
HEMOGLOBIN: 8.4 g/dL — AB (ref 12.0–15.0)
MCH: 27.5 pg (ref 26.0–34.0)
MCHC: 29.7 g/dL — AB (ref 30.0–36.0)
MCV: 92.8 fL (ref 78.0–100.0)
Platelets: 262 10*3/uL (ref 150–400)
RBC: 3.05 MIL/uL — ABNORMAL LOW (ref 3.87–5.11)
RDW: 15 % (ref 11.5–15.5)
WBC: 5.7 10*3/uL (ref 4.0–10.5)

## 2017-07-25 SURGERY — IRRIGATION AND DEBRIDEMENT EXTREMITY
Anesthesia: General | Laterality: Right

## 2017-07-25 MED ORDER — CEFAZOLIN SODIUM 1 G IJ SOLR
INTRAMUSCULAR | Status: AC
  Filled 2017-07-25: qty 20

## 2017-07-25 MED ORDER — SCOPOLAMINE 1 MG/3DAYS TD PT72
MEDICATED_PATCH | TRANSDERMAL | Status: DC | PRN
  Administered 2017-07-25: 1 via TRANSDERMAL

## 2017-07-25 MED ORDER — BISACODYL 10 MG RE SUPP: 10.0000 mg | Freq: Every day | RECTAL | Status: DC | PRN

## 2017-07-25 MED ORDER — ONDANSETRON HCL 4 MG/2ML IJ SOLN
INTRAMUSCULAR | Status: DC | PRN
  Administered 2017-07-25: 4 mg via INTRAVENOUS

## 2017-07-25 MED ORDER — DEXAMETHASONE SODIUM PHOSPHATE 10 MG/ML IJ SOLN
INTRAMUSCULAR | Status: DC | PRN
  Administered 2017-07-25: 5 mg via INTRAVENOUS

## 2017-07-25 MED ORDER — SODIUM CHLORIDE 0.9 % IV SOLN
125.0000 mg | Freq: Once | INTRAVENOUS | Status: AC
  Administered 2017-07-25: 125 mg via INTRAVENOUS
  Filled 2017-07-25: qty 10

## 2017-07-25 MED ORDER — ONDANSETRON HCL 4 MG/2ML IJ SOLN
INTRAMUSCULAR | Status: AC
  Filled 2017-07-25: qty 2

## 2017-07-25 MED ORDER — ACETAMINOPHEN 325 MG PO TABS: 650.0000 mg | ORAL_TABLET | ORAL | Status: DC | PRN

## 2017-07-25 MED ORDER — MIDAZOLAM HCL 5 MG/5ML IJ SOLN
INTRAMUSCULAR | Status: DC | PRN
  Administered 2017-07-25: 1 mg via INTRAVENOUS

## 2017-07-25 MED ORDER — FENTANYL CITRATE (PF) 250 MCG/5ML IJ SOLN
INTRAMUSCULAR | Status: AC
  Filled 2017-07-25: qty 5

## 2017-07-25 MED ORDER — LACTATED RINGERS IV SOLN
INTRAVENOUS | Status: DC
  Administered 2017-07-25: 09:00:00 via INTRAVENOUS

## 2017-07-25 MED ORDER — ACETAMINOPHEN 650 MG RE SUPP: 650.0000 mg | RECTAL | Status: DC | PRN

## 2017-07-25 MED ORDER — SCOPOLAMINE 1 MG/3DAYS TD PT72
MEDICATED_PATCH | TRANSDERMAL | Status: AC
  Filled 2017-07-25: qty 1

## 2017-07-25 MED ORDER — FENTANYL CITRATE (PF) 100 MCG/2ML IJ SOLN
INTRAMUSCULAR | Status: DC | PRN
  Administered 2017-07-25: 50 ug via INTRAVENOUS

## 2017-07-25 MED ORDER — ONDANSETRON HCL 4 MG PO TABS: 4.0000 mg | ORAL_TABLET | Freq: Four times a day (QID) | ORAL | Status: DC | PRN

## 2017-07-25 MED ORDER — 0.9 % SODIUM CHLORIDE (POUR BTL) OPTIME
TOPICAL | Status: DC | PRN
  Administered 2017-07-25: 1000 mL

## 2017-07-25 MED ORDER — MEPERIDINE HCL 25 MG/ML IJ SOLN: 6.2500 mg | INTRAMUSCULAR | Status: DC | PRN

## 2017-07-25 MED ORDER — DEXAMETHASONE SODIUM PHOSPHATE 10 MG/ML IJ SOLN
INTRAMUSCULAR | Status: AC
  Filled 2017-07-25: qty 1

## 2017-07-25 MED ORDER — MIDAZOLAM HCL 2 MG/2ML IJ SOLN
INTRAMUSCULAR | Status: AC
  Filled 2017-07-25: qty 2

## 2017-07-25 MED ORDER — CEFAZOLIN SODIUM-DEXTROSE 2-4 GM/100ML-% IV SOLN
2.0000 g | Freq: Four times a day (QID) | INTRAVENOUS | Status: DC
  Filled 2017-07-25: qty 100

## 2017-07-25 MED ORDER — METOCLOPRAMIDE HCL 5 MG/ML IJ SOLN: 10.0000 mg | Freq: Once | INTRAMUSCULAR | Status: DC | PRN

## 2017-07-25 MED ORDER — CEFAZOLIN SODIUM-DEXTROSE 2-3 GM-%(50ML) IV SOLR
INTRAVENOUS | Status: DC | PRN
  Administered 2017-07-25: 2 g via INTRAVENOUS

## 2017-07-25 MED ORDER — METOCLOPRAMIDE HCL 5 MG PO TABS
5.0000 mg | ORAL_TABLET | Freq: Three times a day (TID) | ORAL | Status: DC | PRN
  Filled 2017-07-25: qty 2

## 2017-07-25 MED ORDER — SODIUM CHLORIDE 0.9 % IV SOLN: INTRAVENOUS | Status: DC

## 2017-07-25 MED ORDER — CHLORHEXIDINE GLUCONATE 4 % EX LIQD
60.0000 mL | Freq: Once | CUTANEOUS | Status: AC
  Administered 2017-07-25: 4 via TOPICAL
  Filled 2017-07-25: qty 60

## 2017-07-25 MED ORDER — DOCUSATE SODIUM 100 MG PO CAPS
100.0000 mg | ORAL_CAPSULE | Freq: Two times a day (BID) | ORAL | Status: DC
  Administered 2017-07-25 – 2017-07-26 (×3): 100 mg via ORAL
  Filled 2017-07-25 (×4): qty 1

## 2017-07-25 MED ORDER — LIDOCAINE 2% (20 MG/ML) 5 ML SYRINGE
INTRAMUSCULAR | Status: DC | PRN
  Administered 2017-07-25: 100 mg via INTRAVENOUS

## 2017-07-25 MED ORDER — SODIUM CHLORIDE 0.9 % IR SOLN
Status: DC | PRN
  Administered 2017-07-25: 3000 mL

## 2017-07-25 MED ORDER — CEFAZOLIN SODIUM-DEXTROSE 2-4 GM/100ML-% IV SOLN: 2.0000 g | INTRAVENOUS | Status: DC

## 2017-07-25 MED ORDER — METHOCARBAMOL 1000 MG/10ML IJ SOLN
500.0000 mg | Freq: Four times a day (QID) | INTRAVENOUS | Status: DC | PRN
  Filled 2017-07-25: qty 5

## 2017-07-25 MED ORDER — PROPOFOL 10 MG/ML IV BOLUS
INTRAVENOUS | Status: DC | PRN
  Administered 2017-07-25: 150 mg via INTRAVENOUS
  Administered 2017-07-25: 50 mg via INTRAVENOUS

## 2017-07-25 MED ORDER — HYDROMORPHONE HCL 1 MG/ML IJ SOLN: 1.0000 mg | INTRAMUSCULAR | Status: DC | PRN

## 2017-07-25 MED ORDER — LACTATED RINGERS IV SOLN: INTRAVENOUS | Status: DC

## 2017-07-25 MED ORDER — CEFAZOLIN SODIUM-DEXTROSE 2-4 GM/100ML-% IV SOLN
2.0000 g | Freq: Four times a day (QID) | INTRAVENOUS | Status: AC
  Administered 2017-07-25 – 2017-07-26 (×2): 2 g via INTRAVENOUS
  Filled 2017-07-25 (×2): qty 100

## 2017-07-25 MED ORDER — METOCLOPRAMIDE HCL 5 MG/ML IJ SOLN: 5.0000 mg | Freq: Three times a day (TID) | INTRAMUSCULAR | Status: DC | PRN

## 2017-07-25 MED ORDER — METHOCARBAMOL 500 MG PO TABS: 500.0000 mg | ORAL_TABLET | Freq: Four times a day (QID) | ORAL | Status: DC | PRN

## 2017-07-25 MED ORDER — POLYETHYLENE GLYCOL 3350 17 G PO PACK
17.0000 g | PACK | Freq: Every day | ORAL | Status: DC | PRN
Start: 2017-07-25 — End: 2017-07-27

## 2017-07-25 MED ORDER — MAGNESIUM CITRATE PO SOLN
1.0000 | Freq: Once | ORAL | Status: DC | PRN
Start: 2017-07-25 — End: 2017-07-25

## 2017-07-25 MED ORDER — OXYCODONE HCL 5 MG PO TABS: 5.0000 mg | ORAL_TABLET | ORAL | Status: DC | PRN

## 2017-07-25 MED ORDER — LIDOCAINE 2% (20 MG/ML) 5 ML SYRINGE
INTRAMUSCULAR | Status: AC
  Filled 2017-07-25: qty 5

## 2017-07-25 MED ORDER — PROPOFOL 10 MG/ML IV BOLUS
INTRAVENOUS | Status: AC
  Filled 2017-07-25: qty 20

## 2017-07-25 MED ORDER — ONDANSETRON HCL 4 MG/2ML IJ SOLN: 4.0000 mg | Freq: Four times a day (QID) | INTRAMUSCULAR | Status: DC | PRN

## 2017-07-25 MED ORDER — FENTANYL CITRATE (PF) 100 MCG/2ML IJ SOLN: 25.0000 ug | INTRAMUSCULAR | Status: DC | PRN

## 2017-07-25 SURGICAL SUPPLY — 34 items
BLADE SURG 21 STRL SS (BLADE) ×3 IMPLANT
BNDG COHESIVE 6X5 TAN STRL LF (GAUZE/BANDAGES/DRESSINGS) ×3 IMPLANT
BNDG GAUZE ELAST 4 BULKY (GAUZE/BANDAGES/DRESSINGS) ×6 IMPLANT
COVER SURGICAL LIGHT HANDLE (MISCELLANEOUS) ×9 IMPLANT
DRAPE U-SHAPE 47X51 STRL (DRAPES) ×3 IMPLANT
DRESSING PREVENA PLUS CUSTOM (GAUZE/BANDAGES/DRESSINGS) ×1 IMPLANT
DRSG ADAPTIC 3X8 NADH LF (GAUZE/BANDAGES/DRESSINGS) ×3 IMPLANT
DRSG PREVENA PLUS CUSTOM (GAUZE/BANDAGES/DRESSINGS) ×3
DRSG VAC ATS SM SENSATRAC (GAUZE/BANDAGES/DRESSINGS) ×3 IMPLANT
DURAPREP 26ML APPLICATOR (WOUND CARE) ×3 IMPLANT
ELECT REM PT RETURN 9FT ADLT (ELECTROSURGICAL)
ELECTRODE REM PT RTRN 9FT ADLT (ELECTROSURGICAL) IMPLANT
GAUZE SPONGE 4X4 12PLY STRL (GAUZE/BANDAGES/DRESSINGS) ×3 IMPLANT
GLOVE BIOGEL PI IND STRL 9 (GLOVE) ×1 IMPLANT
GLOVE BIOGEL PI INDICATOR 9 (GLOVE) ×2
GLOVE SURG ORTHO 9.0 STRL STRW (GLOVE) ×3 IMPLANT
GOWN STRL REUS W/ TWL XL LVL3 (GOWN DISPOSABLE) ×2 IMPLANT
GOWN STRL REUS W/TWL XL LVL3 (GOWN DISPOSABLE) ×4
HANDPIECE INTERPULSE COAX TIP (DISPOSABLE) ×2
KIT BASIN OR (CUSTOM PROCEDURE TRAY) ×3 IMPLANT
KIT ROOM TURNOVER OR (KITS) ×3 IMPLANT
MANIFOLD NEPTUNE II (INSTRUMENTS) ×3 IMPLANT
NS IRRIG 1000ML POUR BTL (IV SOLUTION) ×3 IMPLANT
PACK ORTHO EXTREMITY (CUSTOM PROCEDURE TRAY) ×3 IMPLANT
PAD ARMBOARD 7.5X6 YLW CONV (MISCELLANEOUS) ×6 IMPLANT
SET HNDPC FAN SPRY TIP SCT (DISPOSABLE) ×1 IMPLANT
STOCKINETTE IMPERVIOUS 9X36 MD (GAUZE/BANDAGES/DRESSINGS) IMPLANT
SUT ETHILON 2 0 PSLX (SUTURE) ×12 IMPLANT
SWAB COLLECTION DEVICE MRSA (MISCELLANEOUS) ×3 IMPLANT
SWAB CULTURE ESWAB REG 1ML (MISCELLANEOUS) IMPLANT
TOWEL OR 17X26 10 PK STRL BLUE (TOWEL DISPOSABLE) ×3 IMPLANT
TUBE CONNECTING 12'X1/4 (SUCTIONS) ×1
TUBE CONNECTING 12X1/4 (SUCTIONS) ×2 IMPLANT
YANKAUER SUCT BULB TIP NO VENT (SUCTIONS) ×3 IMPLANT
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
## 2017-07-25 NOTE — Interval H&P Note (Signed)
History and Physical Interval Note:  07/25/2017 9:25 AM  Hailey Ortiz  has presented today for surgery, with the diagnosis of Hematoma Right Knee  The various methods of treatment have been discussed with the patient and family. After consideration of risks, benefits and other options for treatment, the patient has consented to  Procedure(s): REPEAT DEBRIDEMENT RIGHT KNEE WOUND (Right) as a surgical intervention .  The patient's history has been reviewed, patient examined, no change in status, stable for surgery.  I have reviewed the patient's chart and labs.  Questions were answered to the patient's satisfaction.     Nadara MustardMarcus V Adarrius Graeff

## 2017-07-25 NOTE — Anesthesia Postprocedure Evaluation (Signed)
Anesthesia Post Note  Patient: Rogers Seedsheresa [place]  Procedure(s) Performed: REPEAT DEBRIDEMENT RIGHT KNEE WOUND (Right )     Patient location during evaluation: PACU Anesthesia Type: General Level of consciousness: awake and alert Pain management: pain level controlled Vital Signs Assessment: post-procedure vital signs reviewed and stable Respiratory status: spontaneous breathing, nonlabored ventilation, respiratory function stable and patient connected to nasal cannula oxygen Cardiovascular status: blood pressure returned to baseline and stable Postop Assessment: no apparent nausea or vomiting Anesthetic complications: no    Last Vitals:  Vitals:   07/25/17 1155 07/25/17 1210  BP: 125/65 115/69  Pulse: 64 60  Resp: 16 20  Temp:  36.5 C  SpO2: 100% 98%    Last Pain:  Vitals:   07/25/17 1210  TempSrc:   PainSc: 0-No pain                 Phillips Groutarignan, Sadira Standard

## 2017-07-25 NOTE — Anesthesia Postprocedure Evaluation (Signed)
Anesthesia Post Note  Patient: Hailey Ortiz  Procedure(s) Performed: DEBRIDEMENT RIGHT KNEE WOUND (Right Knee)     Patient location during evaluation: PACU Anesthesia Type: General Level of consciousness: awake and alert Pain management: pain level controlled Vital Signs Assessment: post-procedure vital signs reviewed and stable Respiratory status: spontaneous breathing, nonlabored ventilation, respiratory function stable and patient connected to nasal cannula oxygen Cardiovascular status: blood pressure returned to baseline and stable Postop Assessment: no apparent nausea or vomiting Anesthetic complications: no    Last Vitals:  Vitals:   07/24/17 2118 07/25/17 0523  BP: (!) 109/58 (!) 108/51  Pulse: 70 63  Resp: 18 18  Temp: 37 C 36.8 C  SpO2: 100% 100%    Last Pain:  Vitals:   07/25/17 0523  TempSrc: Oral  PainSc:                  Breianna Delfino,JAMES TERRILL

## 2017-07-25 NOTE — Op Note (Addendum)
07/17/2017 - 07/25/2017  11:37 AM  PATIENT:  Hailey Ortiz    PRE-OPERATIVE DIAGNOSIS:  Hematoma Right Knee  POST-OPERATIVE DIAGNOSIS:  Same  PROCEDURE:  REPEAT DEBRIDEMENT RIGHT KNEE WOUND Excision of skin soft tissue muscle and fascia with a knife and Ronjair and Cobb elevator Local tissue rearrangement for wound closure 3 x 15 cm. Application of Prevena wound VAC.  SURGEON:  Nadara MustardMarcus V Duda, MD  PHYSICIAN ASSISTANT:None ANESTHESIA:   General  PREOPERATIVE INDICATIONS:  Hailey Ortiz is a  66 y.o. female with a diagnosis of Hematoma Right Knee who failed conservative measures and elected for surgical management.    The risks benefits and alternatives were discussed with the patient preoperatively including but not limited to the risks of infection, bleeding, nerve injury, cardiopulmonary complications, the need for revision surgery, among others, and the patient was willing to proceed.  OPERATIVE IMPLANTS: Prevena wound VAC  OPERATIVE FINDINGS: recurrent hematoma no abscess hematoma sent for cultures  OPERATIVE PROCEDURE: patient brought the operating room and underwent a general anesthetic. After adequate levels anesthesia obtained patient's right lower extremity was prepped using Betadine paint and draped into a sterile field a timeout was called. The sutures were removed. Patient did have recurrent hematoma but this did not appear infectious there is no abscess hematoma was sent for cultures. Using a curette and Ronjair and knife skin soft tissue muscle and fascia was excised from the right thigh hematoma. The wound was approximately 30 x 15 cm. Local tissue examination was used to perform wound closure. A customized bowl risk of recurrent hematoma. Patient was extubated taken to the PACU in stable condition.  Anticipate patient can be discharged with a Prevena home wound VAC when she is safe with ambulation.

## 2017-07-25 NOTE — Transfer of Care (Signed)
Immediate Anesthesia Transfer of Care Note  Patient: Hailey Ortiz  Procedure(s) Performed: REPEAT DEBRIDEMENT RIGHT KNEE WOUND (Right )  Patient Location: PACU  Anesthesia Type:General  Level of Consciousness: drowsy and patient cooperative  Airway & Oxygen Therapy: Patient Spontanous Breathing and Patient connected to face mask oxygen  Post-op Assessment: Report given to RN and Post -op Vital signs reviewed and stable  Post vital signs: Reviewed and stable  Last Vitals:  Vitals:   07/25/17 0523 07/25/17 1140  BP: (!) 108/51 130/64  Pulse: 63 60  Resp: 18 15  Temp: 36.8 C (!) 36.4 C  SpO2: 100% 100%    Last Pain:  Vitals:   07/25/17 0523  TempSrc: Oral  PainSc:          Complications: No apparent anesthesia complications

## 2017-07-25 NOTE — Progress Notes (Signed)
PT Cancellation Note  Patient Details Name: Hailey Ortiz MRN: [id number] DOB: 1951-03-11   Cancelled Treatment:    Reason Eval/Treat Not Completed: Patient at procedure or test/unavailable. Pt off of the floor to the OR. PT will continue to f/u with pt as available.   Alessandra BevelsJennifer M Sofija Antwi 07/25/2017, 9:41 AM

## 2017-07-25 NOTE — Anesthesia Preprocedure Evaluation (Signed)
Anesthesia Evaluation  Patient identified by MRN, date of birth, ID band Patient awake    Reviewed: Allergy & Precautions, NPO status , Patient's Chart, lab work & pertinent test results  Airway Mallampati: II   Neck ROM: Full    Dental no notable dental hx.    Pulmonary neg pulmonary ROS,    breath sounds clear to auscultation       Cardiovascular hypertension,  Rhythm:Regular Rate:Normal     Neuro/Psych  Headaches,    GI/Hepatic   Endo/Other    Renal/GU Renal InsufficiencyRenal disease     Musculoskeletal   Abdominal   Peds  Hematology  (+) anemia ,   Anesthesia Other Findings   Reproductive/Obstetrics                             Anesthesia Physical  Anesthesia Plan  ASA: II  Anesthesia Plan: General   Post-op Pain Management:    Induction:   PONV Risk Score and Plan: 4 or greater and Ondansetron, Dexamethasone, Midazolam, Scopolamine patch - Pre-op and Treatment may vary due to age or medical condition  Airway Management Planned: LMA  Additional Equipment:   Intra-op Plan:   Post-operative Plan: Extubation in OR  Informed Consent: I have reviewed the patients History and Physical, chart, labs and discussed the procedure including the risks, benefits and alternatives for the proposed anesthesia with the patient or authorized representative who has indicated his/her understanding and acceptance.   Dental advisory given  Plan Discussed with: CRNA  Anesthesia Plan Comments:         Anesthesia Quick Evaluation

## 2017-07-25 NOTE — Anesthesia Procedure Notes (Signed)
Procedure Name: LMA Insertion Date/Time: 07/25/2017 10:59 AM Performed by: Charm BargesBUTLER, Luvada Salamone R Pre-anesthesia Checklist: Patient identified, Emergency Drugs available, Suction available and Patient being monitored Patient Re-evaluated:Patient Re-evaluated prior to induction Oxygen Delivery Method: Circle System Utilized Preoxygenation: Pre-oxygenation with 100% oxygen Induction Type: IV induction Ventilation: Mask ventilation without difficulty LMA: LMA inserted LMA Size: 4.0 Number of attempts: 1 Placement Confirmation: positive ETCO2 Tube secured with: Tape Dental Injury: Teeth and Oropharynx as per pre-operative assessment

## 2017-07-26 ENCOUNTER — Encounter (HOSPITAL_COMMUNITY): Payer: Self-pay | Admitting: Orthopedic Surgery

## 2017-07-26 LAB — CBC WITH DIFFERENTIAL/PLATELET
BASOS PCT: 0 %
Basophils Absolute: 0 10*3/uL (ref 0.0–0.1)
EOS PCT: 0 %
Eosinophils Absolute: 0 10*3/uL (ref 0.0–0.7)
HEMATOCRIT: 26.2 % — AB (ref 36.0–46.0)
HEMOGLOBIN: 8 g/dL — AB (ref 12.0–15.0)
LYMPHS PCT: 22 %
Lymphs Abs: 1.7 10*3/uL (ref 0.7–4.0)
MCH: 28 pg (ref 26.0–34.0)
MCHC: 30.5 g/dL (ref 30.0–36.0)
MCV: 91.6 fL (ref 78.0–100.0)
Monocytes Absolute: 0.9 10*3/uL (ref 0.1–1.0)
Monocytes Relative: 11 %
NEUTROS PCT: 67 %
Neutro Abs: 5.2 10*3/uL (ref 1.7–7.7)
Platelets: 291 10*3/uL (ref 150–400)
RBC: 2.86 MIL/uL — ABNORMAL LOW (ref 3.87–5.11)
RDW: 15.1 % (ref 11.5–15.5)
WBC: 7.8 10*3/uL (ref 4.0–10.5)

## 2017-07-26 LAB — AEROBIC/ANAEROBIC CULTURE (SURGICAL/DEEP WOUND)
CULTURE: NO GROWTH
CULTURE: NO GROWTH

## 2017-07-26 LAB — AEROBIC/ANAEROBIC CULTURE W GRAM STAIN (SURGICAL/DEEP WOUND)

## 2017-07-26 MED ORDER — SODIUM CHLORIDE 0.9 % IV SOLN
125.0000 mg | Freq: Once | INTRAVENOUS | Status: AC
  Administered 2017-07-26: 125 mg via INTRAVENOUS
  Filled 2017-07-26: qty 10
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
## 2017-07-26 NOTE — Progress Notes (Signed)
Physical Therapy Treatment Patient Details Name: Hailey Ortiz MRN: [id number] DOB: [birth year] Today's Date: 07/26/2017    History of Present Illness Pt is a 66 y/o female who presents s/p debridement of R knee and placement of wound VAC 10/17. Pt to return to OR 10/19 for repeat I&D and anticipated wound closure.    PT Comments    Pt continues to make good progress with functional mobility. PT will continue to follow acutely to ensure a safe d/c home.   Follow Up Recommendations  No PT follow up;Supervision for mobility/OOB     Equipment Recommendations  None recommended by PT    Recommendations for Other Services       Precautions / Restrictions Precautions Precautions: Fall Precaution Comments: Wound VAC RLE Restrictions Weight Bearing Restrictions: No    Mobility  Bed Mobility Overal bed mobility: Modified Independent                Transfers Overall transfer level: Needs assistance Equipment used: Rolling walker (2 wheeled) Transfers: Sit to/from Stand Sit to Stand: Supervision         General transfer comment: supervision for safety  Ambulation/Gait Ambulation/Gait assistance: Min guard Ambulation Distance (Feet): 250 Feet Assistive device: Rolling walker (2 wheeled) Gait Pattern/deviations: Step-through pattern;Decreased stride length;Trunk flexed Gait velocity: Decreased Gait velocity interpretation: Below normal speed for age/gender General Gait Details: pt with no pain throughout, no instability or LOB, min guard for safety   Stairs            Wheelchair Mobility    Modified Rankin (Stroke Patients Only)       Balance Overall balance assessment: Needs assistance Sitting-balance support: Feet supported;No upper extremity supported Sitting balance-Leahy Scale: Good     Standing balance support: No upper extremity supported;During functional activity Standing balance-Leahy Scale: Fair Standing balance comment: static standing  balance                            Cognition Arousal/Alertness: Awake/alert Behavior During Therapy: Flat affect Overall Cognitive Status: No family/caregiver present to determine baseline cognitive functioning Area of Impairment: Safety/judgement;Problem solving;Memory                     Memory: Decreased short-term memory   Safety/Judgement: Decreased awareness of safety   Problem Solving: Slow processing        Exercises      General Comments        Pertinent Vitals/Pain Pain Assessment: No/denies pain    Home Living                      Prior Function            PT Goals (current goals can now be found in the care plan section) Acute Rehab PT Goals PT Goal Formulation: With patient Time For Goal Achievement: 07/30/17 Potential to Achieve Goals: Good Progress towards PT goals: Progressing toward goals    Frequency    Min 3X/week      PT Plan Current plan remains appropriate    Co-evaluation              AM-PAC PT "6 Clicks" Daily Activity  Outcome Measure  Difficulty turning over in bed (including adjusting bedclothes, sheets and blankets)?: None Difficulty moving from lying on back to sitting on the side of the bed? : None Difficulty sitting down on and standing up from a chair with arms (e.g.,  wheelchair, bedside commode, etc,.)?: A Little Help needed moving to and from a bed to chair (including a wheelchair)?: A Little Help needed walking in hospital room?: A Little Help needed climbing 3-5 steps with a railing? : A Little 6 Click Score: 20    End of Session Equipment Utilized During Treatment: Gait belt Activity Tolerance: Patient tolerated treatment well Patient left: in bed;with call bell/phone within reach;with bed alarm set Nurse Communication: Mobility status PT Visit Diagnosis: Difficulty in walking, not elsewhere classified (R26.2)     Time: [id number] PT Time Calculation (min) (ACUTE ONLY): 12  min  Charges:  $Gait Training: 8-22 mins                    G Codes:       SlaterJennifer Marion Seese, [place]PT, [place]PT [phone]    Alessandra BevelsJennifer M Arva Slaugh 07/26/2017, 12:43 PM

## 2017-07-27 NOTE — Discharge Summary (Signed)
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 Culture (surgical/deep wound)     Status: None (Preliminary result)   Collection Time: 07/25/17 11:14 AM  Result Value Ref Range Status   Specimen Description LEG RIGHT  Final   Special Requests SWAB  Final   Gram Stain   Final    RARE WBC PRESENT, PREDOMINANTLY PMN NO SQUAMOUS EPITHELIAL CELLS SEEN NO ORGANISMS SEEN    Culture NO GROWTH 2 DAYS  Final   Report Status PENDING  Incomplete    Time coordinating discharge: 35 minutes  SIGNED:  Latrelle Dodrill, MD  [facility] 07/27/2017, 1:49 PM  Pager please text page via  [email or web address] Password [email or web address]
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
## 2017-07-30 ENCOUNTER — Inpatient Hospital Stay: Payer: Self-pay | Admitting: Internal Medicine

## 2017-07-30 LAB — AEROBIC/ANAEROBIC CULTURE (SURGICAL/DEEP WOUND): CULTURE: NO GROWTH

## 2017-07-30 LAB — AEROBIC/ANAEROBIC CULTURE W GRAM STAIN (SURGICAL/DEEP WOUND)

## 2017-07-31 ENCOUNTER — Ambulatory Visit (INDEPENDENT_AMBULATORY_CARE_PROVIDER_SITE_OTHER): Payer: Medicare Other | Admitting: Orthopedic Surgery

## 2017-07-31 DIAGNOSIS — S8011XS Contusion of right lower leg, sequela: Secondary | ICD-10-CM

## 2017-07-31 NOTE — Progress Notes (Signed)
Office Visit Note   Patient: Hailey Ortiz           Date of Birth: 1950-11-11           MRN: [id number] Visit Date: 07/31/2017              Requested by: Kathlee Nations, MD [id number] [place], [place] [address] PCP: Kathlee Nations, MD  Chief Complaint  Patient presents with  . Right Knee - Routine Post Op    07/25/17 Repeat I&D right knee wound      HPI: Patient presents in follow-up status post debridement of a massive hematoma medial aspect of her right knee.  Patient's family states that she  is starting to develop a hematoma in the lateral aspect of the right calf.  Assessment & Plan: Visit Diagnoses:  1. Hematoma of leg, right, sequela     Plan: Discussed that we could proceed with decompression of the lateral hematoma on the right.  Patient's family state they would like to try with compression dressing changes to see if this will resolve on its own.  We will follow-up next week.  Just importance of protein supplements and nutritional intake for healing.  Dial soap cleansing daily with dry dressing and Ace wrap from the ankle to the thigh change daily.  The importance of elevating the foot above her heart to help decrease the swelling she has chronic venous stasis changes in the right leg.  Follow-Up Instructions: Return in about 1 week (around 08/07/2017).   Ortho Exam  Patient is alert, oriented, no adenopathy, well-dressed, normal affect, normal respiratory effort. On examination patient's wound VAC was removed.  There is a little bit of clear serosanguineous drainage the massive hematoma has resolved nicely the wound edges are well approximated there is no cellulitis no purulent drainage no signs of infection.  Patient's hematoma on the lateral aspect of the calf has gotten larger.  There is a fluctuant mass this is superficial there is no signs of compartment syndrome there is no pain with range of motion of her ankle.  Imaging: No results found. No images are  attached to the encounter.  Labs: Lab Results  Component Value Date   REPTSTATUS 07/30/2017 FINAL 07/25/2017   GRAMSTAIN  07/25/2017    RARE WBC PRESENT, PREDOMINANTLY PMN NO SQUAMOUS EPITHELIAL CELLS SEEN NO ORGANISMS SEEN    CULT No growth aerobically or anaerobically. 07/25/2017   LABORGA ESCHERICHIA COLI (A) 07/17/2017    Orders:  No orders of the defined types were placed in this encounter.  No orders of the defined types were placed in this encounter.    Procedures: No procedures performed  Clinical Data: No additional findings.  ROS:  All other systems negative, except as noted in the HPI. Review of Systems  Objective: Vital Signs: There were no vitals taken for this visit.  Specialty Comments:  No specialty comments available.  PMFS History: Patient Active Problem List   Diagnosis Date Noted  . Abscess of knee   . Hematoma of leg, right, sequela   . Cellulitis 07/18/2017  . Right leg swelling 07/18/2017  . Anemia 07/18/2017  . HTN (hypertension) 07/18/2017  . Hyperlipidemia 07/18/2017   Past Medical History:  Diagnosis Date  . Congenital abnormality of kidney   . Headache    migraine  . Hypercholesteremia     Family History  Problem Relation Age of Onset  . Heart failure Mother   . Kidney Stones Mother   . Macular degeneration  Mother   . Rectal cancer Father   . Arthritis/Rheumatoid Father   . Cancer Father        lymph node    Past Surgical History:  Procedure Laterality Date  . ABDOMINAL HYSTERECTOMY     total  . APPENDECTOMY    . CHOLECYSTECTOMY    . I&D EXTREMITY Right 07/23/2017   Procedure: DEBRIDEMENT RIGHT KNEE WOUND;  Surgeon: Nadara Mustarduda, Kaidan Spengler V, MD;  [facility];  Service: Orthopedics;  Laterality: Right;  . I&D EXTREMITY Right 07/25/2017   Procedure: REPEAT DEBRIDEMENT RIGHT KNEE WOUND;  Surgeon: Nadara Mustarduda, Colena Ketterman V, MD;  [facility];  Service: Orthopedics;  Laterality: Right;   Social History   Occupational History  .       retired,  Forensic psychologistfurniture showroom   Social History Main Topics  . Smoking status: Never Smoker  . Smokeless tobacco: Never Used  . Alcohol use No  . Drug use: No  . Sexual activity: Not on file

## 2017-08-03 ENCOUNTER — Emergency Department (HOSPITAL_COMMUNITY): Payer: Medicare Other

## 2017-08-03 ENCOUNTER — Other Ambulatory Visit (INDEPENDENT_AMBULATORY_CARE_PROVIDER_SITE_OTHER): Payer: Self-pay | Admitting: Specialist

## 2017-08-03 ENCOUNTER — Inpatient Hospital Stay (HOSPITAL_COMMUNITY)
Admission: EM | Admit: 2017-08-03 | Discharge: 2017-08-06 | DRG: 572 | Disposition: A | Discharge status: Home / Self Care | Payer: Medicare Other | Attending: Orthopedic Surgery | Admitting: Orthopedic Surgery

## 2017-08-03 ENCOUNTER — Encounter (HOSPITAL_COMMUNITY): Payer: Self-pay | Admitting: Emergency Medicine

## 2017-08-03 DIAGNOSIS — S81801S Unspecified open wound, right lower leg, sequela: Secondary | ICD-10-CM | POA: Diagnosis not present

## 2017-08-03 DIAGNOSIS — S8011XS Contusion of right lower leg, sequela: Secondary | ICD-10-CM

## 2017-08-03 DIAGNOSIS — E785 Hyperlipidemia, unspecified: Secondary | ICD-10-CM | POA: Diagnosis present

## 2017-08-03 DIAGNOSIS — Z7982 Long term (current) use of aspirin: Secondary | ICD-10-CM | POA: Diagnosis not present

## 2017-08-03 DIAGNOSIS — S81801A Unspecified open wound, right lower leg, initial encounter: Secondary | ICD-10-CM | POA: Diagnosis present

## 2017-08-03 DIAGNOSIS — S8011XA Contusion of right lower leg, initial encounter: Secondary | ICD-10-CM | POA: Diagnosis present

## 2017-08-03 DIAGNOSIS — E7800 Pure hypercholesterolemia, unspecified: Secondary | ICD-10-CM | POA: Diagnosis present

## 2017-08-03 DIAGNOSIS — T148XXA Other injury of unspecified body region, initial encounter: Secondary | ICD-10-CM | POA: Diagnosis present

## 2017-08-03 DIAGNOSIS — S8011XD Contusion of right lower leg, subsequent encounter: Secondary | ICD-10-CM

## 2017-08-03 LAB — CBC WITH DIFFERENTIAL/PLATELET
Basophils Absolute: 0 10*3/uL (ref 0.0–0.1)
Basophils Relative: 0 %
Eosinophils Absolute: 0.1 10*3/uL (ref 0.0–0.7)
Eosinophils Relative: 1 %
HEMATOCRIT: 29.9 % — AB (ref 36.0–46.0)
Hemoglobin: 9.1 g/dL — ABNORMAL LOW (ref 12.0–15.0)
LYMPHS ABS: 1.3 10*3/uL (ref 0.7–4.0)
LYMPHS PCT: 20 %
MCH: 27.7 pg (ref 26.0–34.0)
MCHC: 30.4 g/dL (ref 30.0–36.0)
MCV: 91.2 fL (ref 78.0–100.0)
MONO ABS: 0.6 10*3/uL (ref 0.1–1.0)
MONOS PCT: 10 %
NEUTROS PCT: 69 %
Neutro Abs: 4.5 10*3/uL (ref 1.7–7.7)
Platelets: 316 10*3/uL (ref 150–400)
RBC: 3.28 MIL/uL — ABNORMAL LOW (ref 3.87–5.11)
RDW: 15.7 % — AB (ref 11.5–15.5)
WBC: 6.6 10*3/uL (ref 4.0–10.5)

## 2017-08-03 LAB — BASIC METABOLIC PANEL
Anion gap: 8 (ref 5–15)
BUN: 17 mg/dL (ref 6–20)
CO2: 24 mmol/L (ref 22–32)
CREATININE: 0.7 mg/dL (ref 0.44–1.00)
Calcium: 8.6 mg/dL — ABNORMAL LOW (ref 8.9–10.3)
Chloride: 104 mmol/L (ref 101–111)
GFR calc Af Amer: >60 mL/min (ref >60)
GFR calc non Af Amer: >60 mL/min (ref >60)
GLUCOSE: 118 mg/dL — AB (ref 65–99)
Potassium: 3.5 mmol/L (ref 3.5–5.1)
SODIUM: 136 mmol/L (ref 135–145)

## 2017-08-03 LAB — SAMPLE TO BLOOD BANK

## 2017-08-03 LAB — HEPATIC FUNCTION PANEL
ALBUMIN: 3.2 g/dL — AB (ref 3.5–5.0)
ALT: 11 U/L — ABNORMAL LOW (ref 14–54)
AST: 17 U/L (ref 15–41)
Alkaline Phosphatase: 64 U/L (ref 38–126)
Bilirubin, Direct: <0.1 mg/dL — ABNORMAL LOW (ref 0.1–0.5)
TOTAL PROTEIN: 6.3 g/dL — AB (ref 6.5–8.1)
Total Bilirubin: 0.7 mg/dL (ref 0.3–1.2)

## 2017-08-03 LAB — PROTIME-INR
INR: 1.1
PROTHROMBIN TIME: 14.1 s (ref 11.4–15.2)

## 2017-08-03 LAB — APTT: aPTT: 31 seconds (ref 24–36)

## 2017-08-03 MED ORDER — ACETAMINOPHEN 325 MG PO TABS: 650.0000 mg | ORAL_TABLET | ORAL | Status: DC | PRN

## 2017-08-03 MED ORDER — ACETAMINOPHEN 650 MG RE SUPP: 650.0000 mg | RECTAL | Status: DC | PRN

## 2017-08-03 MED ORDER — DOCUSATE SODIUM 100 MG PO CAPS
100.0000 mg | ORAL_CAPSULE | Freq: Two times a day (BID) | ORAL | Status: DC
  Administered 2017-08-03 – 2017-08-05 (×5): 100 mg via ORAL
  Filled 2017-08-03 (×5): qty 1

## 2017-08-03 MED ORDER — ONDANSETRON HCL 4 MG/2ML IJ SOLN
4.0000 mg | Freq: Four times a day (QID) | INTRAMUSCULAR | Status: DC | PRN
Start: 2017-08-03 — End: 2017-08-06

## 2017-08-03 MED ORDER — BISACODYL 5 MG PO TBEC: 5.0000 mg | DELAYED_RELEASE_TABLET | Freq: Every day | ORAL | Status: DC | PRN

## 2017-08-03 MED ORDER — METOCLOPRAMIDE HCL 5 MG PO TABS: 5.0000 mg | ORAL_TABLET | Freq: Three times a day (TID) | ORAL | Status: DC | PRN

## 2017-08-03 MED ORDER — MORPHINE SULFATE (PF) 4 MG/ML IV SOLN: 1.0000 mg | INTRAVENOUS | Status: DC | PRN

## 2017-08-03 MED ORDER — HYDROCODONE-ACETAMINOPHEN 7.5-325 MG PO TABS
2.0000 | ORAL_TABLET | ORAL | Status: DC | PRN
  Administered 2017-08-03: 2 via ORAL
  Filled 2017-08-03: qty 2

## 2017-08-03 MED ORDER — HYDROCODONE-ACETAMINOPHEN 5-325 MG PO TABS: 1.0000 | ORAL_TABLET | ORAL | Status: DC | PRN

## 2017-08-03 MED ORDER — IOPAMIDOL (ISOVUE-300) INJECTION 61%
INTRAVENOUS | Status: AC
Start: 2017-08-03 — End: 2017-08-03
  Administered 2017-08-03: 100 mL
  Filled 2017-08-03: qty 100

## 2017-08-03 MED ORDER — METOCLOPRAMIDE HCL 5 MG/ML IJ SOLN: 5.0000 mg | Freq: Three times a day (TID) | INTRAMUSCULAR | Status: DC | PRN

## 2017-08-03 MED ORDER — LORAZEPAM 2 MG/ML IJ SOLN
1.0000 mg | Freq: Once | INTRAMUSCULAR | Status: AC
  Administered 2017-08-03: 1 mg via INTRAVENOUS
  Filled 2017-08-03: qty 1

## 2017-08-03 MED ORDER — POLYETHYLENE GLYCOL 3350 17 G PO PACK: 17.0000 g | PACK | Freq: Every day | ORAL | Status: DC | PRN

## 2017-08-03 MED ORDER — ONDANSETRON HCL 4 MG PO TABS: 4.0000 mg | ORAL_TABLET | Freq: Four times a day (QID) | ORAL | Status: DC | PRN
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
## 2017-08-03 NOTE — ED Triage Notes (Signed)
Pt had a degloving of her right knee 1 week ago. Pt had surgery done and has had swelling since. Pt seen by Dr today and was told to come to ED for admission for debridement of calf area.

## 2017-08-03 NOTE — Progress Notes (Signed)
Pt. Arrived to the unit around 2240, denies pain, no discomfort noticed, calm and stable vital signs, there is an active order to call admitting doctor upon pt arrival to the unit, called admitting doctor twice, waiting for response and orders, call light within reach, will continue to monitor.

## 2017-08-03 NOTE — ED Notes (Signed)
PURWICK placed for pts comfort

## 2017-08-03 NOTE — ED Provider Notes (Signed)
[facility] Provider Note  CSN: [id number] Arrival date & time: 08/03/17  1412 History   Chief Complaint Chief Complaint  Patient presents with  . Joint Swelling   HPI Hailey Ortiz is a 66 y.o. female with PMH of hyperlipidemia who presents increased swelling of a pre-existing right leg hematoma.  The patient was seen by her orthopedic surgeon today for a postop evaluation after recent drainage of large hematoma on the medial aspect of her right thigh and noted that the hematoma over the lateral aspect of the right calf had markedly increased.  Patient was sent to the emergency room by her orthopedic surgeon for further evaluation and admission.   HPI  Past Medical History:  Diagnosis Date  . Congenital abnormality of kidney   . Headache    migraine  . Hypercholesteremia    Patient Active Problem List   Diagnosis Date Noted  . Abscess of knee   . Hematoma of leg, right, sequela   . Cellulitis 07/18/2017  . Right leg swelling 07/18/2017  . Anemia 07/18/2017  . HTN (hypertension) 07/18/2017  . Hyperlipidemia 07/18/2017   Past Surgical History:  Procedure Laterality Date  . ABDOMINAL HYSTERECTOMY     total  . APPENDECTOMY    . CHOLECYSTECTOMY    . I&D EXTREMITY Right 07/23/2017   Procedure: DEBRIDEMENT RIGHT KNEE WOUND;  Surgeon: Nadara Mustarduda, Marcus V, MD;  [facility];  Service: Orthopedics;  Laterality: Right;  . I&D EXTREMITY Right 07/25/2017   Procedure: REPEAT DEBRIDEMENT RIGHT KNEE WOUND;  Surgeon: Nadara Mustarduda, Marcus V, MD;  [facility];  Service: Orthopedics;  Laterality: Right;   OB History    No data available     Home Medications    Prior to Admission medications   Medication Sig Start Date End Date Taking? Authorizing Provider  amLODipine (NORVASC) 10 MG tablet Take 10 mg by mouth at bedtime.    [provider]  aspirin 325 MG tablet Take 325 mg by mouth at bedtime.    [provider]  Calcium  Carb-Cholecalciferol (CALCIUM-VITAMIN D) 500-200 MG-UNIT tablet Take 1 tablet by mouth at bedtime.    [provider]  cholecalciferol (VITAMIN D) 1000 units tablet Take 1,000 Units by mouth daily.    [provider]  cyanocobalamin (,VITAMIN B-12,) 1000 MCG/ML injection Inject 1,000 mcg into the muscle every 7 (seven) days. 07/04/16   [provider]  folic acid (FOLVITE) 400 MCG tablet Take 400 mcg by mouth daily. 07/01/16   [provider]  metoprolol succinate (TOPROL-XL) 50 MG 24 hr tablet Take 25 mg by mouth daily.  06/21/16   [provider]  Multiple Vitamin (MULTIVITAMIN WITH MINERALS) TABS tablet Take 1 tablet by mouth daily.    [provider]  sertraline (ZOLOFT) 100 MG tablet Take 100 mg by mouth at bedtime.    [provider]  simvastatin (ZOCOR) 40 MG tablet Take 40 mg by mouth at bedtime.  07/10/16   [provider]   Family History Family History  Problem Relation Age of Onset  . Heart failure Mother   . Kidney Stones Mother   . Macular degeneration Mother   . Rectal cancer Father   . Arthritis/Rheumatoid Father   . Cancer Father        lymph node   Social History Social History  Substance Use Topics  . Smoking status: Never Smoker  . Smokeless tobacco: Never Used  . Alcohol use No   Allergies  Lisinopril  Review of Systems Review of Systems  Constitutional: Negative for chills and fever.  HENT: Negative for ear pain and sore throat.   Eyes: Negative for pain and visual disturbance.  Respiratory: Negative for cough and shortness of breath.   Cardiovascular: Positive for leg swelling. Negative for chest pain and palpitations.  Gastrointestinal: Negative for abdominal pain and vomiting.  Genitourinary: Negative for dysuria and hematuria.  Musculoskeletal: Positive for gait problem and joint swelling. Negative for arthralgias and back pain.  Skin: Negative for color change and rash.    Neurological: Negative for seizures and syncope.  All other systems reviewed and are negative.  Physical Exam Updated Vital Signs BP 137/69 (BP Location: Left Arm)   Pulse 82   Temp 98.3 F (36.8 C) (Oral)   Resp 14   Ht 5\' 6"  (1.676 m)   Wt 91.6 kg (202 lb)   SpO2 98%   BMI 32.60 kg/m   Physical Exam  Constitutional: She appears well-developed and well-nourished. No distress.  HENT:  Head: Normocephalic and atraumatic.  Eyes: Pupils are equal, round, and reactive to light. Conjunctivae and EOM are normal.  Neck: Normal range of motion. Neck supple.  Cardiovascular: Normal rate and regular rhythm.   No murmur heard. Pulmonary/Chest: Effort normal and breath sounds normal. No respiratory distress.  Abdominal: Soft. There is no tenderness.  Musculoskeletal: She exhibits edema (significant swelling over the right calf) and tenderness.  Neurological: She is alert.  Skin: Skin is warm and dry.  Psychiatric: She has a normal mood and affect.  Nursing note and vitals reviewed.  ED Treatments / Results  Labs (all labs ordered are listed, but only abnormal results are displayed) Labs Reviewed  BASIC METABOLIC PANEL  CBC WITH DIFFERENTIAL/PLATELET  SAMPLE TO BLOOD BANK   EKG  EKG Interpretation None      Radiology No results found.  Procedures Procedures (including critical care time)  Medications Ordered in ED Medications - No data to display  Initial Impression / Assessment and Plan / ED Course  I have reviewed the triage vital signs and the nursing notes.  Pertinent labs & imaging results that were available during my care of the patient were reviewed by me and considered in my medical decision making (see chart for details).  Hailey Ortiz is a 66 y.o. female with PMH of hyperlipidemia who presents increased swelling of a pre-existing right leg hematoma.  Recently seen by her orthopedic surgeon who advised to come to the emergency department for further  evaluation and admission for possible drainage of hematoma.  Consulted orthopedics review at this time would like to admit the patient for emergent hematoma.  While emergency department patient became very anxious and was given 1 mg of Ativan with improvement of symptoms.  Basic lab studies ordered emergency department: BMP with within normal limits, mild anemia, hemoglobin of 9.1 patient previous.  Patient admitted in stable condition  Final Clinical Impressions(s) / ED Diagnoses   Final diagnoses:  Hematoma of right lower extremity, subsequent encounter   New Prescriptions New Prescriptions   No medications on file     Lamont Snowball, MD 08/05/17 0040    Blane Ohara, MD 08/05/17 [phone]
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
## 2017-08-04 ENCOUNTER — Other Ambulatory Visit (INDEPENDENT_AMBULATORY_CARE_PROVIDER_SITE_OTHER): Payer: Self-pay | Admitting: Orthopedic Surgery

## 2017-08-04 DIAGNOSIS — S8011XS Contusion of right lower leg, sequela: Secondary | ICD-10-CM

## 2017-08-04 MED ORDER — PRO-STAT SUGAR FREE PO LIQD
30.0000 mL | Freq: Two times a day (BID) | ORAL | Status: DC
  Administered 2017-08-04 – 2017-08-05 (×4): 30 mL via ORAL
  Filled 2017-08-04 (×4): qty 30

## 2017-08-04 MED ORDER — CEFAZOLIN SODIUM-DEXTROSE 1-4 GM/50ML-% IV SOLN
1.0000 g | Freq: Three times a day (TID) | INTRAVENOUS | Status: DC
  Administered 2017-08-04 – 2017-08-05 (×6): 1 g via INTRAVENOUS
  Filled 2017-08-04 (×7): qty 50

## 2017-08-04 NOTE — Progress Notes (Signed)
Patient ID: Hailey Ortiz, female   DOB: 06/06/1951, 66 y.o.   MRN: [id number] Patient presents with increased swelling and blistering from the hematoma lateral aspect right leg also with clear serous sanguinous drainage from the medial wound right leg.  Will start on IV antibiotics and will plan for surgery tomorrow morning for debridement of the expanding hematoma lateral aspect right leg and repeat debridement of the medial wound right leg. 
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
## 2017-08-05 ENCOUNTER — Encounter (HOSPITAL_COMMUNITY): Payer: Self-pay | Admitting: *Deleted

## 2017-08-05 ENCOUNTER — Telehealth (INDEPENDENT_AMBULATORY_CARE_PROVIDER_SITE_OTHER): Payer: Self-pay | Admitting: Orthopedic Surgery

## 2017-08-05 MED ORDER — CHLORHEXIDINE GLUCONATE 4 % EX LIQD
60.0000 mL | Freq: Once | CUTANEOUS | Status: AC
  Administered 2017-08-06: 4 via TOPICAL

## 2017-08-05 MED ORDER — CEFAZOLIN SODIUM-DEXTROSE 2-4 GM/100ML-% IV SOLN
2.0000 g | INTRAVENOUS | Status: AC
  Administered 2017-08-06: 2 g via INTRAVENOUS
  Filled 2017-08-05: qty 100

## 2017-08-05 MED ORDER — LORAZEPAM 1 MG PO TABS
1.0000 mg | ORAL_TABLET | Freq: Four times a day (QID) | ORAL | Status: DC | PRN
  Administered 2017-08-05 – 2017-08-06 (×4): 1 mg via ORAL
  Filled 2017-08-05 (×4): qty 1

## 2017-08-05 NOTE — Telephone Encounter (Signed)
[facility] called wanting to talk with Dr. Lajoyce Cornersuda about the patient. She is currently experiencing a lot of anxiety/crying and she was wondering if she could be prescribed Ativan to help. CB # [phone]

## 2017-08-05 NOTE — Plan of Care (Signed)
Problem: Safety: Goal: Ability to remain free from injury will improve Outcome: Progressing Bed alarm in use, pt does not remember to use call bell for assistance and  will get up out of bed.  Staff hourly rounding and more frequently at times to ensure pt safety. Pt has been tearful and anxious throughout the day. Ativan order given this evening and pt has relaxed and calmed some since given (at 1712) along with staff checking in and reassuring frequently.

## 2017-08-05 NOTE — Plan of Care (Signed)
Problem: Pain Managment: Goal: General experience of comfort will improve Outcome: Progressing Pt denies having any pain.

## 2017-08-06 ENCOUNTER — Inpatient Hospital Stay (HOSPITAL_COMMUNITY): Payer: Medicare Other | Admitting: Anesthesiology

## 2017-08-06 ENCOUNTER — Inpatient Hospital Stay (HOSPITAL_COMMUNITY)
Admission: EM | Disposition: A | Discharge status: Home / Self Care | Payer: Self-pay | Source: Home / Self Care | Attending: Specialist

## 2017-08-06 ENCOUNTER — Encounter (HOSPITAL_COMMUNITY): Payer: Self-pay | Admitting: Anesthesiology

## 2017-08-06 ENCOUNTER — Telehealth (INDEPENDENT_AMBULATORY_CARE_PROVIDER_SITE_OTHER): Payer: Self-pay | Admitting: Radiology

## 2017-08-06 DIAGNOSIS — S8011XS Contusion of right lower leg, sequela: Secondary | ICD-10-CM

## 2017-08-06 DIAGNOSIS — S81801S Unspecified open wound, right lower leg, sequela: Secondary | ICD-10-CM

## 2017-08-06 HISTORY — PX: I & D EXTREMITY: SHX5045

## 2017-08-06 SURGERY — IRRIGATION AND DEBRIDEMENT EXTREMITY
Anesthesia: General | Site: Leg Upper | Laterality: Right

## 2017-08-06 MED ORDER — ACETAMINOPHEN 325 MG PO TABS: 650.0000 mg | ORAL_TABLET | ORAL | Status: DC | PRN

## 2017-08-06 MED ORDER — HYDROMORPHONE HCL 1 MG/ML IJ SOLN: 0.2500 mg | INTRAMUSCULAR | Status: DC | PRN

## 2017-08-06 MED ORDER — POLYETHYLENE GLYCOL 3350 17 G PO PACK: 17.0000 g | PACK | Freq: Every day | ORAL | Status: DC | PRN

## 2017-08-06 MED ORDER — MEPERIDINE HCL 25 MG/ML IJ SOLN: 6.2500 mg | INTRAMUSCULAR | Status: DC | PRN

## 2017-08-06 MED ORDER — MAGNESIUM CITRATE PO SOLN: 1.0000 | Freq: Once | ORAL | Status: DC | PRN

## 2017-08-06 MED ORDER — HYDROCODONE-ACETAMINOPHEN 5-325 MG PO TABS: 1.0000 | ORAL_TABLET | Freq: Four times a day (QID) | ORAL | 0 refills | Status: DC | PRN

## 2017-08-06 MED ORDER — MIDAZOLAM HCL 2 MG/2ML IJ SOLN
INTRAMUSCULAR | Status: AC
  Filled 2017-08-06: qty 2

## 2017-08-06 MED ORDER — METOCLOPRAMIDE HCL 5 MG/ML IJ SOLN: 5.0000 mg | Freq: Three times a day (TID) | INTRAMUSCULAR | Status: DC | PRN

## 2017-08-06 MED ORDER — OXYCODONE HCL 5 MG PO TABS: 10.0000 mg | ORAL_TABLET | ORAL | Status: DC | PRN

## 2017-08-06 MED ORDER — ACETAMINOPHEN 650 MG RE SUPP: 650.0000 mg | RECTAL | Status: DC | PRN

## 2017-08-06 MED ORDER — 0.9 % SODIUM CHLORIDE (POUR BTL) OPTIME
TOPICAL | Status: DC | PRN
  Administered 2017-08-06: 1000 mL

## 2017-08-06 MED ORDER — METHOCARBAMOL 1000 MG/10ML IJ SOLN: 500.0000 mg | Freq: Four times a day (QID) | INTRAVENOUS | Status: DC | PRN

## 2017-08-06 MED ORDER — METOCLOPRAMIDE HCL 5 MG PO TABS: 5.0000 mg | ORAL_TABLET | Freq: Three times a day (TID) | ORAL | Status: DC | PRN

## 2017-08-06 MED ORDER — FENTANYL CITRATE (PF) 100 MCG/2ML IJ SOLN
INTRAMUSCULAR | Status: DC | PRN
  Administered 2017-08-06: 50 ug via INTRAVENOUS

## 2017-08-06 MED ORDER — SODIUM CHLORIDE 0.9 % IV SOLN: INTRAVENOUS | Status: DC

## 2017-08-06 MED ORDER — ONDANSETRON HCL 4 MG/2ML IJ SOLN: 4.0000 mg | Freq: Four times a day (QID) | INTRAMUSCULAR | Status: DC | PRN

## 2017-08-06 MED ORDER — LACTATED RINGERS IV SOLN
INTRAVENOUS | Status: DC
  Administered 2017-08-06 (×3): via INTRAVENOUS

## 2017-08-06 MED ORDER — BISACODYL 10 MG RE SUPP
10.0000 mg | Freq: Every day | RECTAL | Status: DC | PRN
Start: 2017-08-06 — End: 2017-08-06

## 2017-08-06 MED ORDER — ONDANSETRON HCL 4 MG PO TABS: 4.0000 mg | ORAL_TABLET | Freq: Four times a day (QID) | ORAL | Status: DC | PRN

## 2017-08-06 MED ORDER — DOCUSATE SODIUM 100 MG PO CAPS: 100.0000 mg | ORAL_CAPSULE | Freq: Two times a day (BID) | ORAL | Status: DC

## 2017-08-06 MED ORDER — ONDANSETRON HCL 4 MG/2ML IJ SOLN
INTRAMUSCULAR | Status: DC | PRN
  Administered 2017-08-06: 4 mg via INTRAVENOUS

## 2017-08-06 MED ORDER — HYDROCODONE-ACETAMINOPHEN 5-325 MG PO TABS
1.0000 | ORAL_TABLET | ORAL | Status: DC | PRN
  Administered 2017-08-06: 1 via ORAL
  Filled 2017-08-06: qty 1

## 2017-08-06 MED ORDER — PROPOFOL 10 MG/ML IV BOLUS
INTRAVENOUS | Status: DC | PRN
  Administered 2017-08-06: 150 mg via INTRAVENOUS
  Administered 2017-08-06: 50 mg via INTRAVENOUS

## 2017-08-06 MED ORDER — HYDROMORPHONE HCL 1 MG/ML IJ SOLN
1.0000 mg | INTRAMUSCULAR | Status: DC | PRN
  Administered 2017-08-06: 1 mg via INTRAVENOUS
  Filled 2017-08-06: qty 1

## 2017-08-06 MED ORDER — LIDOCAINE HCL (CARDIAC) 20 MG/ML IV SOLN
INTRAVENOUS | Status: DC | PRN
  Administered 2017-08-06: 100 mg via INTRAVENOUS

## 2017-08-06 MED ORDER — FENTANYL CITRATE (PF) 250 MCG/5ML IJ SOLN
INTRAMUSCULAR | Status: AC
  Filled 2017-08-06: qty 5

## 2017-08-06 MED ORDER — PROPOFOL 10 MG/ML IV BOLUS
INTRAVENOUS | Status: AC
  Filled 2017-08-06: qty 40

## 2017-08-06 MED ORDER — CEFAZOLIN SODIUM-DEXTROSE 2-4 GM/100ML-% IV SOLN
2.0000 g | Freq: Four times a day (QID) | INTRAVENOUS | Status: DC
  Administered 2017-08-06: 2 g via INTRAVENOUS
  Filled 2017-08-06 (×3): qty 100

## 2017-08-06 MED ORDER — ONDANSETRON HCL 4 MG/2ML IJ SOLN: 4.0000 mg | Freq: Once | INTRAMUSCULAR | Status: DC | PRN

## 2017-08-06 MED ORDER — ALBUMIN HUMAN 5 % IV SOLN
INTRAVENOUS | Status: DC | PRN
  Administered 2017-08-06: 08:00:00 via INTRAVENOUS

## 2017-08-06 MED ORDER — SODIUM CHLORIDE 0.9 % IR SOLN
Status: DC | PRN
  Administered 2017-08-06: 3000 mL

## 2017-08-06 MED ORDER — METHOCARBAMOL 500 MG PO TABS: 500.0000 mg | ORAL_TABLET | Freq: Four times a day (QID) | ORAL | Status: DC | PRN

## 2017-08-06 SURGICAL SUPPLY — 36 items
BLADE SURG 21 STRL SS (BLADE) ×3 IMPLANT
BNDG COHESIVE 6X5 TAN STRL LF (GAUZE/BANDAGES/DRESSINGS) ×6 IMPLANT
BNDG GAUZE ELAST 4 BULKY (GAUZE/BANDAGES/DRESSINGS) ×6 IMPLANT
CANISTER WOUND CARE 500ML ATS (WOUND CARE) ×3 IMPLANT
COVER SURGICAL LIGHT HANDLE (MISCELLANEOUS) ×6 IMPLANT
DRAPE U-SHAPE 47X51 STRL (DRAPES) ×3 IMPLANT
DRESSING PREVENA PLUS CUSTOM (GAUZE/BANDAGES/DRESSINGS) ×1 IMPLANT
DRSG ADAPTIC 3X8 NADH LF (GAUZE/BANDAGES/DRESSINGS) ×3 IMPLANT
DRSG PREVENA PLUS CUSTOM (GAUZE/BANDAGES/DRESSINGS) ×3
DURAPREP 26ML APPLICATOR (WOUND CARE) ×3 IMPLANT
ELECT REM PT RETURN 9FT ADLT (ELECTROSURGICAL)
ELECTRODE REM PT RTRN 9FT ADLT (ELECTROSURGICAL) IMPLANT
GAUZE SPONGE 4X4 12PLY STRL (GAUZE/BANDAGES/DRESSINGS) ×3 IMPLANT
GLOVE BIOGEL PI IND STRL 9 (GLOVE) ×1 IMPLANT
GLOVE BIOGEL PI INDICATOR 9 (GLOVE) ×2
GLOVE SURG ORTHO 9.0 STRL STRW (GLOVE) ×3 IMPLANT
GOWN STRL REUS W/ TWL XL LVL3 (GOWN DISPOSABLE) ×2 IMPLANT
GOWN STRL REUS W/TWL XL LVL3 (GOWN DISPOSABLE) ×4
HANDPIECE INTERPULSE COAX TIP (DISPOSABLE)
KIT BASIN OR (CUSTOM PROCEDURE TRAY) ×3 IMPLANT
KIT PREVENA INCISION MGT20CM45 (CANNISTER) ×3 IMPLANT
KIT ROOM TURNOVER OR (KITS) ×3 IMPLANT
MANIFOLD NEPTUNE II (INSTRUMENTS) ×3 IMPLANT
NS IRRIG 1000ML POUR BTL (IV SOLUTION) ×3 IMPLANT
PACK ORTHO EXTREMITY (CUSTOM PROCEDURE TRAY) ×3 IMPLANT
PAD ARMBOARD 7.5X6 YLW CONV (MISCELLANEOUS) ×6 IMPLANT
SET HNDPC FAN SPRY TIP SCT (DISPOSABLE) IMPLANT
SPONGE LAP 18X18 X RAY DECT (DISPOSABLE) ×3 IMPLANT
STOCKINETTE IMPERVIOUS 9X36 MD (GAUZE/BANDAGES/DRESSINGS) IMPLANT
SUT ETHILON 2 0 PSLX (SUTURE) ×6 IMPLANT
SWAB COLLECTION DEVICE MRSA (MISCELLANEOUS) ×3 IMPLANT
SWAB CULTURE ESWAB REG 1ML (MISCELLANEOUS) IMPLANT
TOWEL OR 17X26 10 PK STRL BLUE (TOWEL DISPOSABLE) ×3 IMPLANT
TUBE CONNECTING 12'X1/4 (SUCTIONS) ×1
TUBE CONNECTING 12X1/4 (SUCTIONS) ×2 IMPLANT
YANKAUER SUCT BULB TIP NO VENT (SUCTIONS) ×3 IMPLANT

## 2017-08-06 NOTE — Interval H&P Note (Signed)
History and Physical Interval Note:  08/06/2017 6:40 AM  Hailey Ortiz  has presented today for surgery, with the diagnosis of Hematoma Right Leg  The various methods of treatment have been discussed with the patient and family. After consideration of risks, benefits and other options for treatment, the patient has consented to  Procedure(s): RIGHT LEG DEBRIDEMENT HEMATOMA (Right) as a surgical intervention .  The patient's history has been reviewed, patient examined, no change in status, stable for surgery.  I have reviewed the patient's chart and labs.  Questions were answered to the patient's satisfaction.     Nadara MustardMarcus V Allea Kassner

## 2017-08-06 NOTE — H&P (View-Only) (Signed)
Patient ID: Hailey Ortiz, female   DOB: [birth year], 66 y.o.   MRN: [id number] Patient presents with increased swelling and blistering from the hematoma lateral aspect right leg also with clear serous sanguinous drainage from the medial wound right leg.  Will start on IV antibiotics and will plan for surgery tomorrow morning for debridement of the expanding hematoma lateral aspect right leg and repeat debridement of the medial wound right leg.

## 2017-08-06 NOTE — Anesthesia Postprocedure Evaluation (Signed)
Anesthesia Post Note  Patient: Hailey Ortiz  Procedure(s) Performed: RIGHT LEG DEBRIDEMENT HEMATOMA (Right Leg Upper)     Patient location during evaluation: PACU Anesthesia Type: General Level of consciousness: awake and alert Pain management: pain level controlled Vital Signs Assessment: post-procedure vital signs reviewed and stable Respiratory status: spontaneous breathing, nonlabored ventilation, respiratory function stable and patient connected to nasal cannula oxygen Cardiovascular status: blood pressure returned to baseline and stable Postop Assessment: no apparent nausea or vomiting Anesthetic complications: no    Last Vitals:  Vitals:   08/06/17 0915 08/06/17 0918  BP:  122/76  Pulse: 80 79  Resp: (!) 22 14  Temp:    SpO2: 98% 99%    Last Pain:  Vitals:   08/06/17 0902  TempSrc:   PainSc: Asleep                 [name] DAVID

## 2017-08-06 NOTE — Op Note (Signed)
08/03/2017 - 08/06/2017  8:56 AM  PATIENT:  Hailey Ortiz    PRE-OPERATIVE DIAGNOSIS:  Hematoma Right Leg  POST-OPERATIVE DIAGNOSIS:  Same  PROCEDURE:  RIGHT LEG DEBRIDEMENT HEMATOMA  SURGEON:  Nadara MustardMarcus V Duda, MD  PHYSICIAN ASSISTANT:None ANESTHESIA:   General  PREOPERATIVE INDICATIONS:  Hailey Seedsheresa Hilmes is a  66 y.o. female with a diagnosis of Hematoma Right Leg who failed conservative measures and elected for surgical management.    The risks benefits and alternatives were discussed with the patient preoperatively including but not limited to the risks of infection, bleeding, nerve injury, cardiopulmonary complications, the need for revision surgery, among others, and the patient was willing to proceed.  OPERATIVE IMPLANTS: Praveena wound VAC.  OPERATIVE FINDINGS: Large hematoma laterally.  This was sent for cultures.  OPERATIVE PROCEDURE: Patient was brought the operating room and underwent a general anesthetic.  After adequate levels of anesthesia were obtained patient's right lower extremity was prepped using DuraPrep draped into a sterile field a timeout was called.  A 10 cm incision was made laterally over the hematoma.  This was evacuated.  A rondure and curette were used to remove soft tissue and fascia.  Electrocautery was used for hemostasis the wound was irrigated with 3 L pulsatile lavage.  After complete debridement the incision was closed using 2-0 nylon.  The medial incision was well approximated there was no fluid drainage from the medial incision.  A Praveena wound VAC was placed over both incisions connected and hooked to suction.  Patient's leg was then wrapped in web roll and 6 inch Coban to provide better compression.  Patient was extubated taken to PACU in stable condition.

## 2017-08-06 NOTE — Telephone Encounter (Signed)
Patient being discharged from hospital today. Rx written for pain per MD. Her daughter will pick up in the office.

## 2017-08-06 NOTE — Care Management Note (Addendum)
Case Management Note  Patient Details  Name: Rogers Seedsheresa Papania MRN: [id number] Date of Birth: 03-May-1951  Subjective/Objective:                    Action/Plan: No PT follow up recommended  Expected Discharge Date:                  Expected Discharge Plan:     In-House Referral:     Discharge planning Services  CM Consult  Post Acute Care Choice:  Home Health, Durable Medical Equipment Choice offered to:  Adult Children  DME Arranged:  3-N-1, Walker rolling DME Agency:  [facility]  HH Arranged:    HH Agency:     Status of Service:  In process, will continue to follow  If discussed at Long Length of Stay Meetings, dates discussed:    Additional Comments:  Kingsley PlanWile, Diogo Anne Marie, RN 08/06/2017, 11:11 AM
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
## 2017-08-06 NOTE — Transfer of Care (Signed)
Immediate Anesthesia Transfer of Care Note  Patient: Hailey Ortiz  Procedure(s) Performed: RIGHT LEG DEBRIDEMENT HEMATOMA (Right Leg Upper)  Patient Location: PACU  Anesthesia Type:General  Level of Consciousness: awake, alert , oriented and sedated  Airway & Oxygen Therapy: Patient Spontanous Breathing and Patient connected to nasal cannula oxygen  Post-op Assessment: Report given to RN, Post -op Vital signs reviewed and stable and Patient moving all extremities  Post vital signs: Reviewed and stable  Last Vitals:  Vitals:   08/06/17 0620 08/06/17 0902  BP: 119/69   Pulse: 97   Resp: 16   Temp: 36.7 C 36.7 C  SpO2: 100%     Last Pain:  Vitals:   08/06/17 0620  TempSrc: Oral  PainSc:       Patients Stated Pain Goal: 2 (08/03/17 2354)  Complications: No apparent anesthesia complications

## 2017-08-06 NOTE — Progress Notes (Signed)
D/c instructions reviewed with pt, husband and daughter via computer screen. Not able to print instructions at this time, MD notified and will complete medrec to allow instructions to print.  Daughter understands all instructions, has pain med script that she picked up at Dr Audrie Liauda's office today, has all other meds at home. Pt going home on Prevena wound vac, pt has gone home on before, daughter and husband know how to care for wound vac. (daughter is a Engineer, civil (consulting)nurse here at [facility]).  She will pick up printed copy of instructions tomorrow which will be placed at the nurses station with pt's name.   Husband and daughter ready to take pt home, pt pain controlled, and anxiety level better at this time and pt discharged via wheelchair with family and belongings.

## 2017-08-06 NOTE — Anesthesia Procedure Notes (Signed)
Procedure Name: LMA Insertion Date/Time: 08/06/2017 8:21 AM Performed by: Fransisca KaufmannMEYER, Paden Senger E Pre-anesthesia Checklist: Patient identified, Emergency Drugs available, Suction available and Patient being monitored Patient Re-evaluated:Patient Re-evaluated prior to induction Oxygen Delivery Method: Circle System Utilized Preoxygenation: Pre-oxygenation with 100% oxygen Induction Type: IV induction Ventilation: Mask ventilation without difficulty LMA: LMA inserted LMA Size: 4.0 Number of attempts: 1 Placement Confirmation: positive ETCO2 Tube secured with: Tape Dental Injury: Teeth and Oropharynx as per pre-operative assessment

## 2017-08-06 NOTE — Anesthesia Preprocedure Evaluation (Signed)
Anesthesia Evaluation  Patient identified by MRN, date of birth, ID band Patient awake    Reviewed: Allergy & Precautions, NPO status , Patient's Chart, lab work & pertinent test results  Airway Mallampati: I  TM Distance: >3 FB Neck ROM: Full    Dental   Pulmonary    Pulmonary exam normal        Cardiovascular hypertension, Normal cardiovascular exam     Neuro/Psych    GI/Hepatic   Endo/Other    Renal/GU      Musculoskeletal   Abdominal   Peds  Hematology   Anesthesia Other Findings   Reproductive/Obstetrics                             Anesthesia Physical Anesthesia Plan  ASA: III  Anesthesia Plan: General   Post-op Pain Management:    Induction: Intravenous  PONV Risk Score and Plan: 3 and Ondansetron, Dexamethasone, Midazolam and Treatment may vary due to age or medical condition  Airway Management Planned:   Additional Equipment:   Intra-op Plan:   Post-operative Plan: Extubation in OR  Informed Consent: I have reviewed the patients History and Physical, chart, labs and discussed the procedure including the risks, benefits and alternatives for the proposed anesthesia with the patient or authorized representative who has indicated his/her understanding and acceptance.     Plan Discussed with: CRNA and Surgeon  Anesthesia Plan Comments:         Anesthesia Quick Evaluation

## 2017-08-07 ENCOUNTER — Ambulatory Visit (INDEPENDENT_AMBULATORY_CARE_PROVIDER_SITE_OTHER): Payer: Medicare Other | Admitting: Orthopedic Surgery

## 2017-08-07 ENCOUNTER — Encounter (HOSPITAL_COMMUNITY): Payer: Self-pay | Admitting: Orthopedic Surgery

## 2017-08-11 LAB — AEROBIC/ANAEROBIC CULTURE W GRAM STAIN (SURGICAL/DEEP WOUND): Culture: NO GROWTH

## 2017-08-11 LAB — AEROBIC/ANAEROBIC CULTURE (SURGICAL/DEEP WOUND)

## 2017-08-13 ENCOUNTER — Ambulatory Visit (INDEPENDENT_AMBULATORY_CARE_PROVIDER_SITE_OTHER): Payer: Medicare Other | Admitting: Orthopedic Surgery

## 2017-08-13 ENCOUNTER — Encounter (INDEPENDENT_AMBULATORY_CARE_PROVIDER_SITE_OTHER): Payer: Self-pay | Admitting: Orthopedic Surgery

## 2017-08-13 DIAGNOSIS — S8011XS Contusion of right lower leg, sequela: Secondary | ICD-10-CM

## 2017-08-13 NOTE — Progress Notes (Signed)
Office Visit Note   Patient: Hailey Ortiz           Date of Birth: 1951-08-26           MRN: [id number] Visit Date: 08/13/2017              Requested by: Kathlee NationsEason, Paul, MD [id number] [place], [place] [id number] PCP: Kathlee NationsEason, Paul, MD  Chief Complaint  Patient presents with  . Right Leg - Routine Post Op      HPI: Patient is a 66 year old woman who is status post debridement of a medial and lateral hematoma to the right leg.  Patient's cultures for both wounds were negative for bacteria.  Patient currently has a wound VAC that encompasses both wounds.  Assessment & Plan: Visit Diagnoses:  1. Hematoma of leg, right, sequela     Plan: We will apply 4 x 4's to the wounds plus a Dynaflex compression wrap.  Plan to follow-up Monday or Tuesday.  At this time we will determine if we need to continue with serial compression wraps versus starting a medical compression stocking.  Importance of keeping the foot elevated level with the heart was discussed.  Follow-Up Instructions: Return in about 1 week (around 08/20/2017).   Ortho Exam  Patient is alert, oriented, no adenopathy, well-dressed, normal affect, normal respiratory effort. Examination the wound VAC and overlying compressive dressing was removed.  There is good wrinkling of the skin of the foot.  Patient does have thin shiny skin from swelling of the distal calf.  There is some small weeping edema from the venous insufficiency.  The lateral wound is healing quite nicely there is no redness no cellulitis no drainage no signs of infection.  The medial wound does have serosanguineous drainage.  There is no surrounding cellulitis patient has been afebrile.  Urine cultures on 07/17/2017 were positive for E. coli.  Imaging: No results found. No images are attached to the encounter.  Labs: Lab Results  Component Value Date   REPTSTATUS 08/11/2017 FINAL 08/06/2017   GRAMSTAIN  08/06/2017    FEW WBC PRESENT,BOTH PMN AND  MONONUCLEAR NO ORGANISMS SEEN    CULT No growth aerobically or anaerobically. 08/06/2017   LABORGA ESCHERICHIA COLI (A) 07/17/2017    Orders:  No orders of the defined types were placed in this encounter.  No orders of the defined types were placed in this encounter.    Procedures: No procedures performed  Clinical Data: No additional findings.  ROS:  All other systems negative, except as noted in the HPI. Review of Systems  Objective: Vital Signs: There were no vitals taken for this visit.  Specialty Comments:  No specialty comments available.  PMFS History: Patient Active Problem List   Diagnosis Date Noted  . Degloving injury of lower leg, right, sequela 08/03/2017    Class: Chronic  . Hematoma 08/03/2017  . Degloving injury of right lower leg 08/03/2017  . Abscess of knee   . Hematoma of leg, right, sequela   . Cellulitis 07/18/2017  . Right leg swelling 07/18/2017  . Anemia 07/18/2017  . HTN (hypertension) 07/18/2017  . Hyperlipidemia 07/18/2017   Past Medical History:  Diagnosis Date  . Congenital abnormality of kidney   . Headache    migraine  . Hypercholesteremia     Family History  Problem Relation Age of Onset  . Heart failure Mother   . Kidney Stones Mother   . Macular degeneration Mother   . Rectal cancer Father   .  Arthritis/Rheumatoid Father   . Cancer Father        lymph node    Past Surgical History:  Procedure Laterality Date  . ABDOMINAL HYSTERECTOMY     total  . APPENDECTOMY    . CHOLECYSTECTOMY     Social History   Occupational History    Comment: retired,  Forensic psychologistfurniture showroom  Tobacco Use  . Smoking status: Never Smoker  . Smokeless tobacco: Never Used  Substance and Sexual Activity  . Alcohol use: No  . Drug use: No  . Sexual activity: Not on file

## 2017-08-15 ENCOUNTER — Ambulatory Visit (INDEPENDENT_AMBULATORY_CARE_PROVIDER_SITE_OTHER): Payer: Medicare Other | Admitting: Family

## 2017-08-15 ENCOUNTER — Encounter (INDEPENDENT_AMBULATORY_CARE_PROVIDER_SITE_OTHER): Payer: Self-pay | Admitting: Family

## 2017-08-15 DIAGNOSIS — S8011XS Contusion of right lower leg, sequela: Secondary | ICD-10-CM | POA: Diagnosis not present

## 2017-08-15 DIAGNOSIS — M7989 Other specified soft tissue disorders: Secondary | ICD-10-CM

## 2017-08-15 MED ORDER — HYDROCODONE-ACETAMINOPHEN 5-325 MG PO TABS: 1.0000 | ORAL_TABLET | Freq: Four times a day (QID) | ORAL | 0 refills | Status: AC | PRN

## 2017-08-15 MED ORDER — DOXYCYCLINE HYCLATE 100 MG PO TABS: 100.0000 mg | ORAL_TABLET | Freq: Two times a day (BID) | ORAL | 0 refills | Status: DC

## 2017-08-15 NOTE — Progress Notes (Signed)
Office Visit Note   Patient: Hailey Ortiz           Date of Birth: 08-02-1951           MRN: [id number] Visit Date: 08/15/2017              Requested by: Kathlee NationsEason, Paul, MD [address] [place], [place] [id number] PCP: Kathlee NationsEason, Paul, MD  No chief complaint on file.     HPI: Patient is a 66 year old woman who is status post debridement of a medial and lateral hematoma to the right leg. Has been in compression wraps, Dynaflex since last visit, earlier this week. Complaining of increased pain to medial knee. Report temperature of 101 yesterday, down to 99.8 today. Daughter concerned for infection.  Assessment & Plan: Visit Diagnoses:  1. Hematoma of leg, right, sequela   2. Right leg swelling     Plan: will provide Rx for Doxycycline. We will apply 4 x 4's to the wounds plus a Dynaflex compression wrap thigh high.  Plan to follow-up  Tuesday.  At this time we will determine if we need to continue with serial compression wraps versus starting a medical compression stocking.  Importance of keeping the foot elevated level with the heart was discussed.  Follow-Up Instructions: Return in about 11 days (around 08/26/2017).   Ortho Exam  Patient is alert, oriented, no adenopathy, well-dressed, normal affect, normal respiratory effort. Examination:  There is good wrinkling of the skin beneath the wrap. There is some small weeping edema from the venous insufficiency.  The lateral wound is healing quite nicely there is no redness no cellulitis no drainage no signs of infection.  The medial wound does have serosanguineous drainage from center of incision. Mild erythema and tenderness surrounding. Mild warmth. There is no surrounding cellulitis.  Imaging: No results found. No images are attached to the encounter.  Labs: Lab Results  Component Value Date   REPTSTATUS 08/11/2017 FINAL 08/06/2017   GRAMSTAIN  08/06/2017    FEW WBC PRESENT,BOTH PMN AND MONONUCLEAR NO ORGANISMS SEEN    CULT No growth aerobically or anaerobically. 08/06/2017   LABORGA ESCHERICHIA COLI (A) 07/17/2017    Orders:  No orders of the defined types were placed in this encounter.  No orders of the defined types were placed in this encounter.    Procedures: No procedures performed  Clinical Data: No additional findings.  ROS:  All other systems negative, except as noted in the HPI. Review of Systems  Constitutional: Negative for chills and fever.  Skin: Positive for color change and wound.    Objective: Vital Signs: There were no vitals taken for this visit.  Specialty Comments:  No specialty comments available.  PMFS History: Patient Active Problem List   Diagnosis Date Noted  . Degloving injury of lower leg, right, sequela 08/03/2017    Class: Chronic  . Hematoma 08/03/2017  . Degloving injury of right lower leg 08/03/2017  . Abscess of knee   . Hematoma of leg, right, sequela   . Cellulitis 07/18/2017  . Right leg swelling 07/18/2017  . Anemia 07/18/2017  . HTN (hypertension) 07/18/2017  . Hyperlipidemia 07/18/2017   Past Medical History:  Diagnosis Date  . Congenital abnormality of kidney   . Headache    migraine  . Hypercholesteremia     Family History  Problem Relation Age of Onset  . Heart failure Mother   . Kidney Stones Mother   . Macular degeneration Mother   . Rectal cancer Father   .  Arthritis/Rheumatoid Father   . Cancer Father        lymph node    Past Surgical History:  Procedure Laterality Date  . ABDOMINAL HYSTERECTOMY     total  . APPENDECTOMY    . CHOLECYSTECTOMY     Social History   Occupational History    Comment: retired,  Forensic psychologistfurniture showroom  Tobacco Use  . Smoking status: Never Smoker  . Smokeless tobacco: Never Used  Substance and Sexual Activity  . Alcohol use: No  . Drug use: No  . Sexual activity: Not on file

## 2017-08-19 ENCOUNTER — Encounter (INDEPENDENT_AMBULATORY_CARE_PROVIDER_SITE_OTHER): Payer: Self-pay | Admitting: Orthopedic Surgery

## 2017-08-19 ENCOUNTER — Ambulatory Visit (INDEPENDENT_AMBULATORY_CARE_PROVIDER_SITE_OTHER): Payer: Medicare Other | Admitting: Orthopedic Surgery

## 2017-08-19 VITALS — Ht 66.0 in | Wt 202.0 lb

## 2017-08-19 DIAGNOSIS — S8011XS Contusion of right lower leg, sequela: Secondary | ICD-10-CM

## 2017-08-19 NOTE — Progress Notes (Signed)
Office Visit Note   Patient: Hailey Ortiz           Date of Birth: May 06, 1951           MRN: [id number] Visit Date: 08/19/2017              Requested by: Kathlee NationsEason, Paul, MD [address] [place], [place] [id number] PCP: Kathlee NationsEason, Paul, MD  Chief Complaint  Patient presents with  . Right Leg - Routine Post Op    08/06/17 right leg debridement hematoma      HPI: Patient is a 66 year old woman who presents for follow-up status post massive hematomas of the right leg.  Status post debridement x2.  She is currently on doxycycline.  Patient family states that the odor and drainage has decreased significantly.  Patient's family states that she keeps her leg elevated approximately 50% of the time.  Assessment & Plan: Visit Diagnoses:  1. Hematoma of leg, right, sequela     Plan: Patient's daughter states that she would like to proceed with doing dressing changes I feel this is appropriate to get the leg was washed more often.  We will have her wash the leg daily dry the leg apply 4 x 4 gauze to all 3 areas that are involved with an Ace wrap from her metatarsal heads up to her thigh.  Try to work on elevation more frequently.  Follow-Up Instructions: Return in about 1 week (around 08/26/2017).   Ortho Exam  Patient is alert, oriented, no adenopathy, well-dressed, normal affect, normal respiratory effort. Patient surgical incisions are well approximated there is one small area of granulation tissue medially which is 3 mm in diameter this was touched with silver nitrate.  She has a venous stasis ulcer over the anterior aspect of the tibia which is 5 mm in diameter she has massive venous stasis swelling in her mid calf region.  The lateral incision is healing nicely.  She is about 2 weeks out from her last debridement of the lateral aspect of her leg.  Imaging: No results found. No images are attached to the encounter.  Labs: Lab Results  Component Value Date   REPTSTATUS 08/11/2017  FINAL 08/06/2017   GRAMSTAIN  08/06/2017    FEW WBC PRESENT,BOTH PMN AND MONONUCLEAR NO ORGANISMS SEEN    CULT No growth aerobically or anaerobically. 08/06/2017   LABORGA ESCHERICHIA COLI (A) 07/17/2017    Orders:  No orders of the defined types were placed in this encounter.  No orders of the defined types were placed in this encounter.    Procedures: No procedures performed  Clinical Data: No additional findings.  ROS:  All other systems negative, except as noted in the HPI. Review of Systems  Objective: Vital Signs: Ht 5\' 6"  (1.676 m)   Wt 202 lb (91.6 kg)   BMI 32.60 kg/m   Specialty Comments:  No specialty comments available.  PMFS History: Patient Active Problem List   Diagnosis Date Noted  . Degloving injury of lower leg, right, sequela 08/03/2017    Class: Chronic  . Hematoma 08/03/2017  . Degloving injury of right lower leg 08/03/2017  . Abscess of knee   . Hematoma of leg, right, sequela   . Cellulitis 07/18/2017  . Right leg swelling 07/18/2017  . Anemia 07/18/2017  . HTN (hypertension) 07/18/2017  . Hyperlipidemia 07/18/2017   Past Medical History:  Diagnosis Date  . Congenital abnormality of kidney   . Headache    migraine  . Hypercholesteremia  Family History  Problem Relation Age of Onset  . Heart failure Mother   . Kidney Stones Mother   . Macular degeneration Mother   . Rectal cancer Father   . Arthritis/Rheumatoid Father   . Cancer Father        lymph node    Past Surgical History:  Procedure Laterality Date  . ABDOMINAL HYSTERECTOMY     total  . APPENDECTOMY    . CHOLECYSTECTOMY     Social History   Occupational History    Comment: retired,  Forensic psychologistfurniture showroom  Tobacco Use  . Smoking status: Never Smoker  . Smokeless tobacco: Never Used  Substance and Sexual Activity  . Alcohol use: No  . Drug use: No  . Sexual activity: Not on file

## 2017-08-25 ENCOUNTER — Ambulatory Visit (INDEPENDENT_AMBULATORY_CARE_PROVIDER_SITE_OTHER): Payer: Medicare Other | Admitting: Family

## 2017-08-26 ENCOUNTER — Encounter (INDEPENDENT_AMBULATORY_CARE_PROVIDER_SITE_OTHER): Payer: Self-pay | Admitting: Orthopedic Surgery

## 2017-08-26 ENCOUNTER — Ambulatory Visit (INDEPENDENT_AMBULATORY_CARE_PROVIDER_SITE_OTHER): Payer: Medicare Other | Admitting: Orthopedic Surgery

## 2017-08-26 VITALS — Ht 66.0 in | Wt 202.0 lb

## 2017-08-26 DIAGNOSIS — I87331 Chronic venous hypertension (idiopathic) with ulcer and inflammation of right lower extremity: Secondary | ICD-10-CM | POA: Diagnosis not present

## 2017-08-26 DIAGNOSIS — L97919 Non-pressure chronic ulcer of unspecified part of right lower leg with unspecified severity: Secondary | ICD-10-CM

## 2017-08-26 DIAGNOSIS — M7989 Other specified soft tissue disorders: Secondary | ICD-10-CM

## 2017-08-26 DIAGNOSIS — S8011XS Contusion of right lower leg, sequela: Secondary | ICD-10-CM

## 2017-08-26 NOTE — Progress Notes (Signed)
Office Visit Note   Patient: Hailey Ortiz           Date of Birth: 06-Mar-1951           MRN: [id number] Visit Date: 08/26/2017              Requested by: Kathlee NationsEason, Paul, MD [address] [place], [place] [id number] PCP: Kathlee NationsEason, Paul, MD  Chief Complaint  Patient presents with  . Right Leg - Routine Post Op    08/06/17 right leg debridement hematoma      HPI: Patient is a 66 year old woman who presents status post debridement of 2 massive hematomas proximal medial right thigh and lateral right calf.  Patient has developed a new ulcer over the junction of the mid and distal third anterior medial right tibia.  Patient's daughter has been packing and wrapping this with Kerlix.  Patient's leg has been dependent for several hours causing increased swelling.  The patient's daughter has started her on Lasix 20 mg a day.  Assessment & Plan: Visit Diagnoses:  1. Hematoma of leg, right, sequela   2. Right leg swelling   3. 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  This will require 2 Dynaflex wraps.  We will packed the wound open with silver cell apply the Dynaflex wrap and harvest the sutures from the medial and lateral incisions.  Discussed the importance of elevation.  We will follow-up on Monday.  Follow-Up Instructions: Return in about 1 week (around 09/02/2017).   Ortho Exam  Patient is alert, oriented, no adenopathy, well-dressed, normal affect, normal respiratory effort. Examination patient has a new venous stasis ulcer over the anterior medial tibia.  This is 2 cm in diameter 1 cm deep.  There is also a soft spot just proximal to this area which appears that it would also be a hematoma that would break down as well.  The surgical incisions are healing quite nicely there is no redness no cellulitis no odor no signs of infection.  We will  harvest the sutures packed the wound open apply compression discussed the importance of elevation.  Imaging: No results found. No images are attached to the encounter.  Labs: Lab Results  Component Value Date   REPTSTATUS 08/11/2017 FINAL 08/06/2017   GRAMSTAIN  08/06/2017    FEW WBC PRESENT,BOTH PMN AND MONONUCLEAR NO ORGANISMS SEEN    CULT No growth aerobically or anaerobically. 08/06/2017   LABORGA ESCHERICHIA COLI (A) 07/17/2017    @LABSALLVALUES (HGBA1)@  @BMI1 @  Orders:  No orders of the defined types were placed in this encounter.  No orders of the defined types were placed in this encounter.    Procedures: No procedures performed  Clinical Data: No additional findings.  ROS:  All other systems negative, except as noted in the HPI. Review of Systems  Objective: Vital Signs: Ht 5\' 6"  (1.676 m)   Wt 202 lb (91.6 kg)   BMI 32.60 kg/m   Specialty Comments:  No specialty comments available.  PMFS History: Patient Active Problem List   Diagnosis Date Noted  . 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 Degloving injury of lower leg, right, sequela 08/03/2017    Class: Chronic  . Hematoma 08/03/2017  . Degloving injury of right lower leg 08/03/2017  . Abscess of knee   .  Hematoma of leg, right, sequela   . Cellulitis 07/18/2017  . Right leg swelling 07/18/2017  . Anemia 07/18/2017  . HTN (hypertension) 07/18/2017  . Hyperlipidemia 07/18/2017   Past Medical History:  Diagnosis Date  . Congenital abnormality of kidney   . Headache    migraine  . Hypercholesteremia     Family History  Problem Relation Age of Onset  . Heart failure Mother   . Kidney Stones Mother   . Macular degeneration Mother   . Rectal cancer Father   . Arthritis/Rheumatoid Father   . Cancer Father        lymph node    Past Surgical History:  Procedure Laterality Date  . ABDOMINAL HYSTERECTOMY      total  . APPENDECTOMY    . CHOLECYSTECTOMY    . I&D EXTREMITY Right 07/23/2017   Procedure: DEBRIDEMENT RIGHT KNEE WOUND;  Surgeon: Nadara Mustarduda, Marcus V, MD;  [facility];  Service: Orthopedics;  Laterality: Right;  . I&D EXTREMITY Right 07/25/2017   Procedure: REPEAT DEBRIDEMENT RIGHT KNEE WOUND;  Surgeon: Nadara Mustarduda, Marcus V, MD;  [facility];  Service: Orthopedics;  Laterality: Right;  . I&D EXTREMITY Right 08/06/2017   Procedure: RIGHT LEG DEBRIDEMENT HEMATOMA;  Surgeon: Nadara Mustarduda, Marcus V, MD;  [facility];  Service: Orthopedics;  Laterality: Right;   Social History   Occupational History    Comment: retired,  Forensic psychologistfurniture showroom  Tobacco Use  . Smoking status: Never Smoker  . Smokeless tobacco: Never Used  Substance and Sexual Activity  . Alcohol use: No  . Drug use: No  . Sexual activity: Not on file

## 2017-09-01 ENCOUNTER — Ambulatory Visit (INDEPENDENT_AMBULATORY_CARE_PROVIDER_SITE_OTHER): Payer: Medicare Other | Admitting: Orthopedic Surgery

## 2017-09-01 ENCOUNTER — Encounter (INDEPENDENT_AMBULATORY_CARE_PROVIDER_SITE_OTHER): Payer: Self-pay | Admitting: Orthopedic Surgery

## 2017-09-01 DIAGNOSIS — S8011XS Contusion of right lower leg, sequela: Secondary | ICD-10-CM

## 2017-09-01 NOTE — Progress Notes (Signed)
Office Visit Note   Patient: Hailey Ortiz           Date of Birth: 07-16-1951           MRN: [id number] Visit Date: 09/01/2017              Requested by: Kathlee NationsEason, Paul, MD [address] [facility] [place], [place] [id number] PCP: Kathlee NationsEason, Paul, MD  Chief Complaint  Patient presents with  . Right Leg - Follow-up      HPI: Patient presents in follow-up she status post irrigation and debridement of 2 massive ulcers 1 medial on the right thigh the other lateral to the right calf.  These are healing quite nicely.  With increased swelling patient has developed a new ulcer over the anterior medial aspect of the right leg at the junction of the middle and distal third.  This is completely separate from her 2 other ulcers.  Patient and family have been doing dressing changes with compression and packing the wound open and washing with soap and water.  Patient has been on doxycycline.  Patient complains of pain around this wound states that the other wounds are asymptomatic.  Assessment & Plan: Visit Diagnoses:  1. Hematoma of leg, right, sequela     Plan: Due to the pain and increasing size of the ulcer we would need to proceed with surgical intervention discussed that we will set her up for excisional debridement of the ulcerative tissue using a cleanse choice wound VAC to promote granulation tissue anticipate discharging to home with a wound VAC.  Previous cultures for her wounds have all been negative for infection.  Follow-Up Instructions: Return in about 2 weeks (around 09/15/2017).   Ortho Exam  Patient is alert, oriented, no adenopathy, well-dressed, normal affect, normal respiratory effort. Examination the medial and lateral ulcers are healing nicely.  There is no redness no cellulitis no drainage.  She has 2 small areas of hyper granulation tissue on the medial thigh incision which are about 5 mm and 10 mm in diameter.  The hyper granulation tissue was touched with silver nitrate.  Semination  the ulcer over the anterior medial border of the tibia is larger and deeper.  This is approximately 3 cm deep and 3 cm in diameter with no purulence no odor no drainage there is some mild redness over the area of softness just proximal to this area but the skin is not broken down over this area.  This is most likely hematoma that communicates with the open wound.  Imaging: No results found. No images are attached to the encounter.  Labs: Lab Results  Component Value Date   REPTSTATUS 08/11/2017 FINAL 08/06/2017   GRAMSTAIN  08/06/2017    FEW WBC PRESENT,BOTH PMN AND MONONUCLEAR NO ORGANISMS SEEN    CULT No growth aerobically or anaerobically. 08/06/2017   LABORGA ESCHERICHIA COLI (A) 07/17/2017    @LABSALLVALUES (HGBA1)@  @BMI1 @  Orders:  No orders of the defined types were placed in this encounter.  No orders of the defined types were placed in this encounter.    Procedures: No procedures performed  Clinical Data: No additional findings.  ROS:  All other systems negative, except as noted in the HPI. Review of Systems  Objective: Vital Signs: There were no vitals taken for this visit.  Specialty Comments:  No specialty comments available.  PMFS History: Patient Active Problem List   Diagnosis Date Noted  . 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 Degloving injury of lower leg, right, sequela 08/03/2017    Class: Chronic  . Hematoma 08/03/2017  . Degloving injury of right lower leg 08/03/2017  . Abscess of knee   . Hematoma of leg, right, sequela   . Cellulitis 07/18/2017  . Right leg swelling 07/18/2017  . Anemia 07/18/2017  . HTN (hypertension) 07/18/2017  . Hyperlipidemia 07/18/2017   Past Medical History:  Diagnosis Date  . Congenital abnormality of kidney   . Headache    migraine  . Hypercholesteremia     Family History  Problem Relation Age of Onset  . Heart failure Mother    . Kidney Stones Mother   . Macular degeneration Mother   . Rectal cancer Father   . Arthritis/Rheumatoid Father   . Cancer Father        lymph node    Past Surgical History:  Procedure Laterality Date  . ABDOMINAL HYSTERECTOMY     total  . APPENDECTOMY    . CHOLECYSTECTOMY    . I&D EXTREMITY Right 07/23/2017   Procedure: DEBRIDEMENT RIGHT KNEE WOUND;  Surgeon: Nadara Mustarduda, Marcus V, MD;  [facility];  Service: Orthopedics;  Laterality: Right;  . I&D EXTREMITY Right 07/25/2017   Procedure: REPEAT DEBRIDEMENT RIGHT KNEE WOUND;  Surgeon: Nadara Mustarduda, Marcus V, MD;  [facility];  Service: Orthopedics;  Laterality: Right;  . I&D EXTREMITY Right 08/06/2017   Procedure: RIGHT LEG DEBRIDEMENT HEMATOMA;  Surgeon: Nadara Mustarduda, Marcus V, MD;  [facility];  Service: Orthopedics;  Laterality: Right;   Social History   Occupational History    Comment: retired,  Forensic psychologistfurniture showroom  Tobacco Use  . Smoking status: Never Smoker  . Smokeless tobacco: Never Used  Substance and Sexual Activity  . Alcohol use: No  . Drug use: No  . Sexual activity: Not on file

## 2017-09-02 ENCOUNTER — Ambulatory Visit (INDEPENDENT_AMBULATORY_CARE_PROVIDER_SITE_OTHER): Payer: Medicare Other | Admitting: Orthopedic Surgery

## 2017-09-04 ENCOUNTER — Other Ambulatory Visit (INDEPENDENT_AMBULATORY_CARE_PROVIDER_SITE_OTHER): Payer: Self-pay | Admitting: Family

## 2017-09-04 ENCOUNTER — Encounter (HOSPITAL_COMMUNITY): Payer: Self-pay | Admitting: *Deleted

## 2017-09-04 ENCOUNTER — Other Ambulatory Visit: Payer: Self-pay

## 2017-09-04 NOTE — Pre-Procedure Instructions (Signed)
    Hailey Ortiz  09/04/2017      [id number] - [place], [place] - [address] [place] [id number] Phone: [phone] Fax: [phone]    Your procedure is scheduled on Friday, September 05, 2017  [facility] at [facility]:30 A.M.  Call this number if you have problems the morning of surgery:  [phone]   Remember:  Do not eat food or drink liquids after midnight.  Take these medicines the morning of surgery with A SIP OF WATER :metoprolol succinate (TOPROL-XL), if needed: HYDROcodone-acetaminophen (NORCO/VICODIN) for pain,  hydrOXYzine (ATARAX/VISTARIL) for anxiety Stop taking vitamins, fish oil, Probiotic and herbal medications. Do not take any NSAIDs ie: Ibuprofen, Advil, Naproxen (Aleve), Motrin, BC and Goody Powder; stop now.  Do not wear jewelry, make-up or nail polish.  Do not wear lotions, powders, or perfumes, or deoderant.  Do not shave 48 hours prior to surgery.    Do not bring valuables to the hospital.  [facility] any belongings or valuables.  Contacts, dentures or bridgework may not be worn into surgery.  Leave your suitcase in the car.  After surgery it may be brought to your room.  For patients admitted to the hospital, discharge time will be determined by your treatment team.  Patients discharged the day of surgery will not be allowed to drive home.

## 2017-09-04 NOTE — Progress Notes (Signed)
SDW-pre-op call was completed by pt daughter, Melody, Charity fundraiserN. Daughter also given pre-op instruction sheet here in the office. Daughter denies that pt C/O SOB and chest pain. Daughter denies that pt is under the care of a cardiologist. Daughter denies that pt had a stress test and cardiac cath. Daughter denies that pt had a chest x ray within the last year. Daughter denies recent labs.

## 2017-09-05 ENCOUNTER — Inpatient Hospital Stay (HOSPITAL_COMMUNITY)
Admission: RE | Admit: 2017-09-05 | Discharge: 2017-09-08 | DRG: 571 | Disposition: A | Discharge status: Home / Self Care | Payer: Medicare Other | Source: Ambulatory Visit | Attending: Orthopedic Surgery | Admitting: Orthopedic Surgery

## 2017-09-05 ENCOUNTER — Encounter (HOSPITAL_COMMUNITY)
Admission: RE | Disposition: A | Discharge status: Home / Self Care | Payer: Self-pay | Source: Ambulatory Visit | Attending: Orthopedic Surgery

## 2017-09-05 ENCOUNTER — Inpatient Hospital Stay (HOSPITAL_COMMUNITY): Payer: Medicare Other | Admitting: Certified Registered Nurse Anesthetist

## 2017-09-05 ENCOUNTER — Other Ambulatory Visit: Payer: Self-pay

## 2017-09-05 ENCOUNTER — Encounter (HOSPITAL_COMMUNITY): Payer: Self-pay | Admitting: General Practice

## 2017-09-05 DIAGNOSIS — S8011XS Contusion of right lower leg, sequela: Secondary | ICD-10-CM

## 2017-09-05 DIAGNOSIS — Z7982 Long term (current) use of aspirin: Secondary | ICD-10-CM | POA: Diagnosis not present

## 2017-09-05 DIAGNOSIS — E7800 Pure hypercholesterolemia, unspecified: Secondary | ICD-10-CM | POA: Diagnosis present

## 2017-09-05 DIAGNOSIS — I10 Essential (primary) hypertension: Secondary | ICD-10-CM | POA: Diagnosis present

## 2017-09-05 DIAGNOSIS — K219 Gastro-esophageal reflux disease without esophagitis: Secondary | ICD-10-CM | POA: Diagnosis present

## 2017-09-05 DIAGNOSIS — Z79899 Other long term (current) drug therapy: Secondary | ICD-10-CM

## 2017-09-05 DIAGNOSIS — L97919 Non-pressure chronic ulcer of unspecified part of right lower leg with unspecified severity: Principal | ICD-10-CM | POA: Diagnosis present

## 2017-09-05 DIAGNOSIS — I872 Venous insufficiency (chronic) (peripheral): Secondary | ICD-10-CM | POA: Diagnosis present

## 2017-09-05 DIAGNOSIS — L02415 Cutaneous abscess of right lower limb: Secondary | ICD-10-CM | POA: Diagnosis present

## 2017-09-05 DIAGNOSIS — G43909 Migraine, unspecified, not intractable, without status migrainosus: Secondary | ICD-10-CM | POA: Diagnosis present

## 2017-09-05 DIAGNOSIS — Q639 Congenital malformation of kidney, unspecified: Secondary | ICD-10-CM | POA: Diagnosis not present

## 2017-09-05 HISTORY — DX: Other injury of unspecified body region, initial encounter: T14.8XXA

## 2017-09-05 HISTORY — DX: Disorientation, unspecified: R41.0

## 2017-09-05 HISTORY — DX: Varicose veins of unspecified lower extremity with ulcer of unspecified site: L97.909

## 2017-09-05 HISTORY — DX: Essential (primary) hypertension: I10

## 2017-09-05 HISTORY — PX: I & D EXTREMITY: SHX5045

## 2017-09-05 HISTORY — DX: Varicose veins of unspecified lower extremity with ulcer of unspecified site: I83.009

## 2017-09-05 HISTORY — DX: Gastro-esophageal reflux disease without esophagitis: K21.9

## 2017-09-05 LAB — CBC
HCT: 31.6 % — ABNORMAL LOW (ref 36.0–46.0)
HEMOGLOBIN: 9.5 g/dL — AB (ref 12.0–15.0)
MCH: 25.5 pg — AB (ref 26.0–34.0)
MCHC: 30.1 g/dL (ref 30.0–36.0)
MCV: 84.7 fL (ref 78.0–100.0)
Platelets: 343 10*3/uL (ref 150–400)
RBC: 3.73 MIL/uL — ABNORMAL LOW (ref 3.87–5.11)
RDW: 15.7 % — ABNORMAL HIGH (ref 11.5–15.5)
WBC: 7.3 10*3/uL (ref 4.0–10.5)

## 2017-09-05 LAB — BASIC METABOLIC PANEL
ANION GAP: 6 (ref 5–15)
BUN: 15 mg/dL (ref 6–20)
CALCIUM: 8.7 mg/dL — AB (ref 8.9–10.3)
CO2: 30 mmol/L (ref 22–32)
CREATININE: 0.93 mg/dL (ref 0.44–1.00)
Chloride: 103 mmol/L (ref 101–111)
GFR calc Af Amer: >60 mL/min (ref >60)
GFR calc non Af Amer: >60 mL/min (ref >60)
GLUCOSE: 93 mg/dL (ref 65–99)
Potassium: 3.4 mmol/L — ABNORMAL LOW (ref 3.5–5.1)
Sodium: 139 mmol/L (ref 135–145)

## 2017-09-05 SURGERY — IRRIGATION AND DEBRIDEMENT EXTREMITY
Anesthesia: General | Laterality: Right

## 2017-09-05 MED ORDER — OXYCODONE HCL 5 MG PO TABS
10.0000 mg | ORAL_TABLET | ORAL | Status: DC | PRN
  Administered 2017-09-06: 10 mg via ORAL
  Filled 2017-09-05: qty 2

## 2017-09-05 MED ORDER — CHLORHEXIDINE GLUCONATE 4 % EX LIQD: 60.0000 mL | Freq: Once | CUTANEOUS | Status: DC

## 2017-09-05 MED ORDER — PHENYLEPHRINE 40 MCG/ML (10ML) SYRINGE FOR IV PUSH (FOR BLOOD PRESSURE SUPPORT)
PREFILLED_SYRINGE | INTRAVENOUS | Status: AC
  Filled 2017-09-05: qty 10

## 2017-09-05 MED ORDER — PHENYLEPHRINE 40 MCG/ML (10ML) SYRINGE FOR IV PUSH (FOR BLOOD PRESSURE SUPPORT)
PREFILLED_SYRINGE | INTRAVENOUS | Status: DC | PRN
  Administered 2017-09-05: 80 ug via INTRAVENOUS
  Administered 2017-09-05: 40 ug via INTRAVENOUS

## 2017-09-05 MED ORDER — FUROSEMIDE 20 MG PO TABS
20.0000 mg | ORAL_TABLET | Freq: Every day | ORAL | Status: DC
  Administered 2017-09-05 – 2017-09-08 (×4): 20 mg via ORAL
  Filled 2017-09-05 (×4): qty 1

## 2017-09-05 MED ORDER — ALBUMIN HUMAN 5 % IV SOLN
INTRAVENOUS | Status: DC | PRN
  Administered 2017-09-05: 10:00:00 via INTRAVENOUS

## 2017-09-05 MED ORDER — MAGNESIUM CITRATE PO SOLN: 1.0000 | Freq: Once | ORAL | Status: DC | PRN

## 2017-09-05 MED ORDER — ONDANSETRON HCL 4 MG/2ML IJ SOLN: 4.0000 mg | Freq: Four times a day (QID) | INTRAMUSCULAR | Status: DC | PRN

## 2017-09-05 MED ORDER — HYDROMORPHONE HCL 1 MG/ML IJ SOLN
INTRAMUSCULAR | Status: AC
  Administered 2017-09-05: 0.5 mg via INTRAVENOUS
  Filled 2017-09-05: qty 1

## 2017-09-05 MED ORDER — CEFAZOLIN SODIUM-DEXTROSE 1-4 GM/50ML-% IV SOLN
1.0000 g | Freq: Three times a day (TID) | INTRAVENOUS | Status: DC
  Administered 2017-09-05 – 2017-09-08 (×9): 1 g via INTRAVENOUS
  Filled 2017-09-05 (×11): qty 50

## 2017-09-05 MED ORDER — LIDOCAINE 2% (20 MG/ML) 5 ML SYRINGE
INTRAMUSCULAR | Status: DC | PRN
  Administered 2017-09-05: 80 mg via INTRAVENOUS

## 2017-09-05 MED ORDER — BISACODYL 10 MG RE SUPP: 10.0000 mg | Freq: Every day | RECTAL | Status: DC | PRN

## 2017-09-05 MED ORDER — PROPOFOL 10 MG/ML IV BOLUS
INTRAVENOUS | Status: AC
  Filled 2017-09-05: qty 20

## 2017-09-05 MED ORDER — POLYETHYLENE GLYCOL 3350 17 G PO PACK: 17.0000 g | PACK | Freq: Every day | ORAL | Status: DC | PRN

## 2017-09-05 MED ORDER — METHOCARBAMOL 500 MG PO TABS: 500.0000 mg | ORAL_TABLET | Freq: Four times a day (QID) | ORAL | Status: DC | PRN

## 2017-09-05 MED ORDER — METHOCARBAMOL 1000 MG/10ML IJ SOLN: 500.0000 mg | Freq: Four times a day (QID) | INTRAVENOUS | Status: DC | PRN

## 2017-09-05 MED ORDER — MIDAZOLAM HCL 2 MG/2ML IJ SOLN
INTRAMUSCULAR | Status: AC
  Filled 2017-09-05: qty 2

## 2017-09-05 MED ORDER — LIDOCAINE 2% (20 MG/ML) 5 ML SYRINGE
INTRAMUSCULAR | Status: AC
  Filled 2017-09-05: qty 5

## 2017-09-05 MED ORDER — OXYCODONE HCL 5 MG/5ML PO SOLN: 5.0000 mg | Freq: Once | ORAL | Status: DC | PRN

## 2017-09-05 MED ORDER — DEXAMETHASONE SODIUM PHOSPHATE 10 MG/ML IJ SOLN
INTRAMUSCULAR | Status: DC | PRN
  Administered 2017-09-05: 5 mg via INTRAVENOUS

## 2017-09-05 MED ORDER — PROMETHAZINE HCL 25 MG/ML IJ SOLN: 6.2500 mg | INTRAMUSCULAR | Status: DC | PRN

## 2017-09-05 MED ORDER — SIMVASTATIN 40 MG PO TABS
40.0000 mg | ORAL_TABLET | Freq: Every day | ORAL | Status: DC
  Administered 2017-09-05 – 2017-09-07 (×3): 40 mg via ORAL
  Filled 2017-09-05 (×3): qty 1

## 2017-09-05 MED ORDER — ONDANSETRON HCL 4 MG PO TABS: 4.0000 mg | ORAL_TABLET | Freq: Four times a day (QID) | ORAL | Status: DC | PRN

## 2017-09-05 MED ORDER — HYDROMORPHONE HCL 1 MG/ML IJ SOLN
INTRAMUSCULAR | Status: AC
  Filled 2017-09-05: qty 1

## 2017-09-05 MED ORDER — ONDANSETRON HCL 4 MG/2ML IJ SOLN
INTRAMUSCULAR | Status: DC | PRN
  Administered 2017-09-05: 4 mg via INTRAVENOUS

## 2017-09-05 MED ORDER — ONDANSETRON HCL 4 MG/2ML IJ SOLN
INTRAMUSCULAR | Status: AC
  Filled 2017-09-05: qty 2

## 2017-09-05 MED ORDER — ASPIRIN EC 81 MG PO TBEC
81.0000 mg | DELAYED_RELEASE_TABLET | Freq: Every evening | ORAL | Status: DC
  Administered 2017-09-05 – 2017-09-07 (×3): 81 mg via ORAL
  Filled 2017-09-05 (×3): qty 1

## 2017-09-05 MED ORDER — METOCLOPRAMIDE HCL 5 MG/ML IJ SOLN: 5.0000 mg | Freq: Three times a day (TID) | INTRAMUSCULAR | Status: DC | PRN

## 2017-09-05 MED ORDER — EPHEDRINE SULFATE-NACL 50-0.9 MG/10ML-% IV SOSY
PREFILLED_SYRINGE | INTRAVENOUS | Status: DC | PRN
  Administered 2017-09-05 (×4): 10 mg via INTRAVENOUS

## 2017-09-05 MED ORDER — HYDROMORPHONE HCL 1 MG/ML IJ SOLN
0.2500 mg | INTRAMUSCULAR | Status: DC | PRN
  Administered 2017-09-05 (×3): 0.5 mg via INTRAVENOUS

## 2017-09-05 MED ORDER — FAMOTIDINE 20 MG PO TABS
20.0000 mg | ORAL_TABLET | Freq: Every day | ORAL | Status: DC
  Administered 2017-09-05 – 2017-09-07 (×3): 20 mg via ORAL
  Filled 2017-09-05 (×3): qty 1

## 2017-09-05 MED ORDER — EPHEDRINE 5 MG/ML INJ
INTRAVENOUS | Status: AC
  Filled 2017-09-05: qty 10

## 2017-09-05 MED ORDER — SODIUM CHLORIDE 0.9 % IR SOLN
Status: DC | PRN
  Administered 2017-09-05: 3000 mL

## 2017-09-05 MED ORDER — ACETAMINOPHEN 325 MG PO TABS: 650.0000 mg | ORAL_TABLET | ORAL | Status: DC | PRN

## 2017-09-05 MED ORDER — METOCLOPRAMIDE HCL 5 MG PO TABS: 5.0000 mg | ORAL_TABLET | Freq: Three times a day (TID) | ORAL | Status: DC | PRN

## 2017-09-05 MED ORDER — HYDROCODONE-ACETAMINOPHEN 5-325 MG PO TABS
1.0000 | ORAL_TABLET | ORAL | Status: DC | PRN
  Administered 2017-09-05 – 2017-09-08 (×7): 1 via ORAL
  Filled 2017-09-05 (×7): qty 1

## 2017-09-05 MED ORDER — 0.9 % SODIUM CHLORIDE (POUR BTL) OPTIME
TOPICAL | Status: DC | PRN
  Administered 2017-09-05: 1000 mL

## 2017-09-05 MED ORDER — CEFAZOLIN SODIUM-DEXTROSE 2-4 GM/100ML-% IV SOLN
2.0000 g | INTRAVENOUS | Status: AC
  Administered 2017-09-05: 2 g via INTRAVENOUS
  Filled 2017-09-05: qty 100

## 2017-09-05 MED ORDER — HYDROMORPHONE HCL 1 MG/ML IJ SOLN: 1.0000 mg | INTRAMUSCULAR | Status: DC | PRN

## 2017-09-05 MED ORDER — PROPOFOL 10 MG/ML IV BOLUS
INTRAVENOUS | Status: DC | PRN
  Administered 2017-09-05: 150 mg via INTRAVENOUS

## 2017-09-05 MED ORDER — HYDROXYZINE HCL 10 MG PO TABS
10.0000 mg | ORAL_TABLET | Freq: Three times a day (TID) | ORAL | Status: DC | PRN
Start: 2017-09-05 — End: 2017-09-08
  Administered 2017-09-07: 10 mg via ORAL
  Filled 2017-09-05 (×2): qty 1

## 2017-09-05 MED ORDER — FENTANYL CITRATE (PF) 250 MCG/5ML IJ SOLN
INTRAMUSCULAR | Status: AC
  Filled 2017-09-05: qty 5

## 2017-09-05 MED ORDER — DOCUSATE SODIUM 100 MG PO CAPS
100.0000 mg | ORAL_CAPSULE | Freq: Two times a day (BID) | ORAL | Status: DC
  Administered 2017-09-05 – 2017-09-08 (×6): 100 mg via ORAL
  Filled 2017-09-05 (×6): qty 1

## 2017-09-05 MED ORDER — ACETAMINOPHEN 650 MG RE SUPP: 650.0000 mg | RECTAL | Status: DC | PRN

## 2017-09-05 MED ORDER — OXYCODONE HCL 5 MG PO TABS: 5.0000 mg | ORAL_TABLET | Freq: Once | ORAL | Status: DC | PRN

## 2017-09-05 MED ORDER — SODIUM CHLORIDE 0.9 % IV SOLN
INTRAVENOUS | Status: DC
  Administered 2017-09-05: 15:00:00 via INTRAVENOUS

## 2017-09-05 MED ORDER — METOPROLOL SUCCINATE ER 25 MG PO TB24
12.5000 mg | ORAL_TABLET | Freq: Every day | ORAL | Status: DC
  Administered 2017-09-06 – 2017-09-08 (×2): 12.5 mg via ORAL
  Filled 2017-09-05 (×3): qty 1

## 2017-09-05 MED ORDER — DEXAMETHASONE SODIUM PHOSPHATE 10 MG/ML IJ SOLN
INTRAMUSCULAR | Status: AC
  Filled 2017-09-05: qty 1

## 2017-09-05 MED ORDER — LACTATED RINGERS IV SOLN
INTRAVENOUS | Status: DC
  Administered 2017-09-05: 10:00:00 via INTRAVENOUS

## 2017-09-05 MED ORDER — FENTANYL CITRATE (PF) 100 MCG/2ML IJ SOLN
INTRAMUSCULAR | Status: DC | PRN
  Administered 2017-09-05: 50 ug via INTRAVENOUS
  Administered 2017-09-05 (×2): 25 ug via INTRAVENOUS

## 2017-09-05 MED ORDER — SERTRALINE HCL 100 MG PO TABS
100.0000 mg | ORAL_TABLET | Freq: Every day | ORAL | Status: DC
  Administered 2017-09-05 – 2017-09-07 (×3): 100 mg via ORAL
  Filled 2017-09-05 (×3): qty 1

## 2017-09-05 SURGICAL SUPPLY — 41 items
BLADE SURG 21 STRL SS (BLADE) ×2 IMPLANT
BNDG COHESIVE 4X5 TAN STRL (GAUZE/BANDAGES/DRESSINGS) ×2 IMPLANT
BNDG COHESIVE 6X5 TAN STRL LF (GAUZE/BANDAGES/DRESSINGS) IMPLANT
BNDG GAUZE ELAST 4 BULKY (GAUZE/BANDAGES/DRESSINGS) ×2 IMPLANT
COVER SURGICAL LIGHT HANDLE (MISCELLANEOUS) ×4 IMPLANT
DRAPE INCISE IOBAN 66X45 STRL (DRAPES) ×2 IMPLANT
DRAPE U-SHAPE 47X51 STRL (DRAPES) ×2 IMPLANT
DRESSING PREVENA PLUS CUSTOM (GAUZE/BANDAGES/DRESSINGS) ×1 IMPLANT
DRESSING VERAFLO CLEANSE CC (GAUZE/BANDAGES/DRESSINGS) ×1 IMPLANT
DRSG ADAPTIC 3X8 NADH LF (GAUZE/BANDAGES/DRESSINGS) ×2 IMPLANT
DRSG PREVENA PLUS CUSTOM (GAUZE/BANDAGES/DRESSINGS) ×2
DRSG VERAFLO CLEANSE CC (GAUZE/BANDAGES/DRESSINGS) ×2
DURAPREP 26ML APPLICATOR (WOUND CARE) ×2 IMPLANT
ELECT REM PT RETURN 9FT ADLT (ELECTROSURGICAL)
ELECTRODE REM PT RTRN 9FT ADLT (ELECTROSURGICAL) IMPLANT
GAUZE SPONGE 4X4 12PLY STRL (GAUZE/BANDAGES/DRESSINGS) ×2 IMPLANT
GLOVE BIOGEL PI IND STRL 9 (GLOVE) ×1 IMPLANT
GLOVE BIOGEL PI INDICATOR 9 (GLOVE) ×1
GLOVE SURG ORTHO 9.0 STRL STRW (GLOVE) ×2 IMPLANT
GOWN STRL REUS W/ TWL XL LVL3 (GOWN DISPOSABLE) ×2 IMPLANT
GOWN STRL REUS W/TWL XL LVL3 (GOWN DISPOSABLE) ×2
HANDPIECE INTERPULSE COAX TIP (DISPOSABLE)
KIT BASIN OR (CUSTOM PROCEDURE TRAY) ×2 IMPLANT
KIT DRSG PREVENA PLUS 7DAY 125 (MISCELLANEOUS) ×2 IMPLANT
KIT ROOM TURNOVER OR (KITS) ×2 IMPLANT
MANIFOLD NEPTUNE II (INSTRUMENTS) ×2 IMPLANT
NS IRRIG 1000ML POUR BTL (IV SOLUTION) ×2 IMPLANT
PACK ORTHO EXTREMITY (CUSTOM PROCEDURE TRAY) ×2 IMPLANT
PAD ARMBOARD 7.5X6 YLW CONV (MISCELLANEOUS) ×4 IMPLANT
PAD CAST 4YDX4 CTTN HI CHSV (CAST SUPPLIES) ×1 IMPLANT
PADDING CAST COTTON 4X4 STRL (CAST SUPPLIES) ×1
SET HNDPC FAN SPRY TIP SCT (DISPOSABLE) IMPLANT
SPONGE LAP 18X18 X RAY DECT (DISPOSABLE) ×4 IMPLANT
STOCKINETTE IMPERVIOUS 9X36 MD (GAUZE/BANDAGES/DRESSINGS) IMPLANT
SUT ETHILON 2 0 PSLX (SUTURE) ×8 IMPLANT
SWAB COLLECTION DEVICE MRSA (MISCELLANEOUS) ×2 IMPLANT
SWAB CULTURE ESWAB REG 1ML (MISCELLANEOUS) IMPLANT
TOWEL OR 17X26 10 PK STRL BLUE (TOWEL DISPOSABLE) ×2 IMPLANT
TUBE CONNECTING 12X1/4 (SUCTIONS) ×2 IMPLANT
WND VAC CANISTER 500ML (MISCELLANEOUS) ×2 IMPLANT
YANKAUER SUCT BULB TIP NO VENT (SUCTIONS) ×2 IMPLANT

## 2017-09-05 NOTE — Op Note (Signed)
09/05/2017  10:51 AM  PATIENT:  Hailey Ortiz    PRE-OPERATIVE DIAGNOSIS:  Abscess/Ulcer Right Leg  POST-OPERATIVE DIAGNOSIS:  Same  PROCEDURE:  RIGHT LEG DEBRIDEMENT OF ULCER times 2, APPLY WOUND VAC, Obtain cultures from soft tissue Local tissue rearrangement for wound closure 7 x 13 cm and 7 x 3 cm.  SURGEON:  Nadara MustardMarcus V Lynnex Fulp, MD  PHYSICIAN ASSISTANT:None ANESTHESIA:   General  PREOPERATIVE INDICATIONS:  Hailey Ortiz is a  66 y.o. female with a diagnosis of Abscess/Ulcer Right Leg who failed conservative measures and elected for surgical management.    The risks benefits and alternatives were discussed with the patient preoperatively including but not limited to the risks of infection, bleeding, nerve injury, cardiopulmonary complications, the need for revision surgery, among others, and the patient was willing to proceed.  OPERATIVE IMPLANTS: Praveena incisional wound VAC  OPERATIVE FINDINGS: Large necrotic fat and deep soft tissue, proximal wound 3 x 7 cm and 3 cm deep distal wound 7 x 13 cm and 5 cm deep  OPERATIVE PROCEDURE: Patient was brought to the operating room and underwent a general anesthetic.  After adequate levels of anesthesia were obtained patient's right lower extremity was prepped using DuraPrep draped into a sterile field a timeout was called.  Patient had a proximal incision with necrotic drainage.  This area of necrosis was ellipsed out necrotic tissue was excised the wound was irrigated with pulsatile lavage and the incision was closed with local tissue rearrangement for wound closure 3 x 7 cm.  The distal wound was a small wound that was 3 cm in diameter however this extended proximally with significant amount of undermining necrotic soft tissue.  This area of necrotic tissue was ellipsed out this left the wound 13 x 7 cm and 5 cm deep.  The necrotic tissue was sent for cultures.  There is no purulence no cellulitis no clinical signs of infection but patient did  have a large area necrotic tissue we will send this for cultures and keep her on IV antibiotics until the cultures are finalized.  The wound was irrigated with pulsatile lavage she has significant venous insufficiency and electrocautery was used for hemostasis.  He received after debridement with excision of the skin and soft tissue and fascia with a 21 blade knife and rondure local tissue rearrangement was performed for wound closure 10 x 7 cm.  Patient had a well approximated wound edges.  The Praveena wound VAC plus purple foam was used this had the cleansed choice of foam attached to the base of the foam this was covered over the 2 wounds and a bridge was placed to bridge the wounds.  This was then secured with Ioban.  This had a good suction fit, a web roll and Coban wrap was wrapped from the metatarsal heads to proximal of the knee.  Patient was extubated taken to PACU in stable condition.  Postoperative plan continue IV antibiotics with Kefzol awaiting cultures.  Once the cultures are finalized we can discharge to home on appropriate antibiotics if necessary, we will continue the wound VAC for a week.  Patient may be weightbearing as tolerated bilateral lower extremities there was a compression wrap on top of the wound VAC to provide improved compression.

## 2017-09-05 NOTE — Anesthesia Preprocedure Evaluation (Addendum)
Anesthesia Evaluation  Patient identified by MRN, date of birth, ID band Patient awake    Reviewed: Allergy & Precautions, NPO status , Patient's Chart, lab work & pertinent test results  Airway Mallampati: II  TM Distance: >3 FB Neck ROM: Full    Dental no notable dental hx.    Pulmonary neg pulmonary ROS,    Pulmonary exam normal breath sounds clear to auscultation       Cardiovascular hypertension, Pt. on medications negative cardio ROS Normal cardiovascular exam Rhythm:Regular Rate:Normal     Neuro/Psych  Headaches, negative neurological ROS  negative psych ROS   GI/Hepatic negative GI ROS, Neg liver ROS, GERD  ,  Endo/Other  negative endocrine ROS  Renal/GU negative Renal ROS  negative genitourinary   Musculoskeletal negative musculoskeletal ROS (+)   Abdominal   Peds negative pediatric ROS (+)  Hematology negative hematology ROS (+)   Anesthesia Other Findings   Reproductive/Obstetrics negative OB ROS                             Anesthesia Physical Anesthesia Plan  ASA: II  Anesthesia Plan: General   Post-op Pain Management:    Induction: Intravenous  PONV Risk Score and Plan: 3 and Ondansetron, Dexamethasone and Midazolam  Airway Management Planned: LMA  Additional Equipment:   Intra-op Plan:   Post-operative Plan: Extubation in OR  Informed Consent: I have reviewed the patients History and Physical, chart, labs and discussed the procedure including the risks, benefits and alternatives for the proposed anesthesia with the patient or authorized representative who has indicated his/her understanding and acceptance.   Dental advisory given  Plan Discussed with: CRNA  Anesthesia Plan Comments:        Anesthesia Quick Evaluation

## 2017-09-05 NOTE — H&P (Signed)
Hailey Ortiz is an 66 y.o. female.   Chief Complaint: New hematoma ulcer right leg HPI: Patient presents in follow-up she status post irrigation and debridement of 2 massive ulcers 1 medial on the right thigh the other lateral to the right calf.  These are healing quite nicely.  With increased swelling patient has developed a new ulcer over the anterior medial aspect of the right leg at the junction of the middle and distal third.  This is completely separate from her 2 other ulcers.  Patient and family have been doing dressing changes with compression and packing the wound open and washing with soap and water.  Patient has been on doxycycline.  Patient complains of pain around this wound states that the other wounds are asymptomatic.     Past Medical History:  Diagnosis Date  . Confusion   . Congenital abnormality of kidney   . GERD (gastroesophageal reflux disease)   . Headache    migraine  . Hematoma    right leg  . Hypercholesteremia   . Hypertension   . Venous stasis ulcer (HCC)     Past Surgical History:  Procedure Laterality Date  . ABDOMINAL HYSTERECTOMY     total  . APPENDECTOMY    . CHOLECYSTECTOMY    . I&D EXTREMITY Right 07/23/2017   Procedure: DEBRIDEMENT RIGHT KNEE WOUND;  Surgeon: Nadara Mustarduda, Marcus V, MD;  [facility];  Service: Orthopedics;  Laterality: Right;  . I&D EXTREMITY Right 07/25/2017   Procedure: REPEAT DEBRIDEMENT RIGHT KNEE WOUND;  Surgeon: Nadara Mustarduda, Marcus V, MD;  [facility];  Service: Orthopedics;  Laterality: Right;  . I&D EXTREMITY Right 08/06/2017   Procedure: RIGHT LEG DEBRIDEMENT HEMATOMA;  Surgeon: Nadara Mustarduda, Marcus V, MD;  [facility];  Service: Orthopedics;  Laterality: Right;    Family History  Problem Relation Age of Onset  . Heart failure Mother   . Kidney Stones Mother   . Macular degeneration Mother   . Cancer Mother   . Rectal cancer Father   . Arthritis/Rheumatoid Father   . Colon cancer Father   . Cancer Father    Social  History:  reports that  has never smoked. she has never used smokeless tobacco. She reports that she does not drink alcohol or use drugs.  Allergies: No Known Allergies  No medications prior to admission.    No results found for this or any previous visit (from the past 48 hour(s)). No results found.  Review of Systems  All other systems reviewed and are negative.   There were no vitals taken for this visit. Physical Exam  Patient is alert, oriented, no adenopathy, well-dressed, normal affect, normal respiratory effort. Examination the medial and lateral ulcers are healing nicely.  There is no redness no cellulitis no drainage.  She has 2 small areas of hyper granulation tissue on the medial thigh incision which are about 5 mm and 10 mm in diameter.    Examination the ulcer over the anterior medial border of the tibia is larger and deeper.  This is approximately 3 cm deep and 3 cm in diameter with no purulence no odor no drainage there is some mild redness over the area of softness just proximal to this area but the skin is not broken down over this area.  This is most likely hematoma that communicates with the open wound.   Assessment/Plan  1. Hematoma of leg, right, sequela     Plan: Due to the pain and increasing size of the  ulcer we would need to proceed with surgical intervention discussed that we will set her up for excisional debridement of the ulcerative tissue using a cleanse choice wound VAC to promote granulation tissue anticipate discharging to home with a wound VAC.  Previous cultures for her wounds have all been negative for infection.      Nadara MustardMarcus V Duda, MD 09/05/2017, 6:35 AM

## 2017-09-05 NOTE — Anesthesia Postprocedure Evaluation (Signed)
Anesthesia Post Note  Patient: Hailey Ortiz  Procedure(s) Performed: RIGHT LEG DEBRIDEMENT OF ULCER, APPLY WOUND VAC (Right )     Patient location during evaluation: PACU Anesthesia Type: General Level of consciousness: awake and alert Pain management: pain level controlled Vital Signs Assessment: post-procedure vital signs reviewed and stable Respiratory status: spontaneous breathing, nonlabored ventilation and respiratory function stable Cardiovascular status: blood pressure returned to baseline and stable Postop Assessment: no apparent nausea or vomiting Anesthetic complications: no    Last Vitals:  Vitals:   09/05/17 1100 09/05/17 1115  BP: (!) 96/50 (!) 90/46  Pulse: 68 66  Resp: 18 18  Temp:    SpO2: 100% 95%    Last Pain:  Vitals:   09/05/17 1047  TempSrc:   PainSc: 0-No pain                 Lowella CurbWarren Ray Miller

## 2017-09-05 NOTE — Transfer of Care (Signed)
Immediate Anesthesia Transfer of Care Note  Patient: Hailey Ortiz  Procedure(s) Performed: RIGHT LEG DEBRIDEMENT OF ULCER, APPLY WOUND VAC (Right )  Patient Location: PACU  Anesthesia Type:General  Level of Consciousness: drowsy and patient cooperative  Airway & Oxygen Therapy: Patient Spontanous Breathing and Patient connected to nasal cannula oxygen  Post-op Assessment: Report given to RN and Post -op Vital signs reviewed and stable  Post vital signs: Reviewed and stable  Last Vitals:  Vitals:   09/05/17 0856 09/05/17 1047  BP: (!) 105/49   Pulse: 67   Resp: 18   Temp: 37 C (P) 36.5 C  SpO2: 99%     Last Pain:  Vitals:   09/05/17 1047  TempSrc:   PainSc: (P) 0-No pain         Complications: No apparent anesthesia complications

## 2017-09-05 NOTE — Anesthesia Procedure Notes (Signed)
Procedure Name: LMA Insertion Date/Time: 09/05/2017 9:56 AM Performed by: Waynard EdwardsSmith, Lahari Suttles A, CRNA Pre-anesthesia Checklist: Patient identified, Emergency Drugs available, Suction available and Patient being monitored Patient Re-evaluated:Patient Re-evaluated prior to induction Oxygen Delivery Method: Circle system utilized Preoxygenation: Pre-oxygenation with 100% oxygen Induction Type: IV induction Ventilation: Mask ventilation without difficulty LMA: LMA inserted LMA Size: 4.0 Number of attempts: 1 Placement Confirmation: positive ETCO2 and breath sounds checked- equal and bilateral Tube secured with: Tape Dental Injury: Teeth and Oropharynx as per pre-operative assessment

## 2017-09-06 ENCOUNTER — Encounter (HOSPITAL_COMMUNITY): Payer: Self-pay | Admitting: Orthopedic Surgery

## 2017-09-06 LAB — BASIC METABOLIC PANEL
Anion gap: 6 (ref 5–15)
BUN: 17 mg/dL (ref 6–20)
CALCIUM: 8.4 mg/dL — AB (ref 8.9–10.3)
CHLORIDE: 102 mmol/L (ref 101–111)
CO2: 29 mmol/L (ref 22–32)
CREATININE: 0.88 mg/dL (ref 0.44–1.00)
Glucose, Bld: 109 mg/dL — ABNORMAL HIGH (ref 65–99)
Potassium: 3.5 mmol/L (ref 3.5–5.1)
SODIUM: 137 mmol/L (ref 135–145)

## 2017-09-06 LAB — GLUCOSE, CAPILLARY
GLUCOSE-CAPILLARY: 113 mg/dL — AB (ref 65–99)
GLUCOSE-CAPILLARY: 112 mg/dL — AB (ref 65–99)
Glucose-Capillary: 121 mg/dL — ABNORMAL HIGH (ref 65–99)
Glucose-Capillary: 101 mg/dL — ABNORMAL HIGH (ref 65–99)

## 2017-09-06 NOTE — Progress Notes (Signed)
Subjective: 1 Day Post-Op Procedure(s) (LRB): RIGHT LEG DEBRIDEMENT OF ULCER, APPLY WOUND VAC (Right) Patient reports pain as mild and moderate.    Objective: Vital signs in last 24 hours: Temp:  [97.7 F (36.5 C)-98.1 F (36.7 C)] 98.1 F (36.7 C) (12/01 0416) Pulse Rate:  [65-72] 72 (12/01 0943) Resp:  [14-18] 16 (12/01 0416) BP: (90-112)/(44-54) 112/54 (12/01 0943) SpO2:  [93 %-100 %] 98 % (12/01 0416)  Intake/Output from previous day: 11/30 0701 - 12/01 0700 In: 1564.8 [P.O.:480; I.V.:534.8; IV Piggyback:400] Out: 761 [Urine:1; Drains:260; Blood:500] Intake/Output this shift: Total I/O In: 240 [P.O.:240] Out: 1 [Urine:1]  Recent Labs    09/05/17 0900  HGB 9.5*   Recent Labs    09/05/17 0900  WBC 7.3  RBC 3.73*  HCT 31.6*  PLT 343   Recent Labs    09/05/17 0900 09/06/17 0617  NA 139 137  K 3.4* 3.5  CL 103 102  CO2 30 29  BUN 15 17  CREATININE 0.93 0.88  GLUCOSE 93 109*  CALCIUM 8.7* 8.4*    MODERATE WBC PRESENT, PREDOMINANTLY PMN  FEW GRAM NEGATIVE RODS      Gram stain -  No results for input(s): LABPT, INR in the last 72 hours.  Sensation intact distally  Assessment/Plan: 1 Day Post-Op Procedure(s) (LRB): RIGHT LEG DEBRIDEMENT OF ULCER, APPLY WOUND VAC (Right) Advance diet  Awaiting final cultures  Jacqualine CodeBrian Petrarca 09/06/2017, 11:12 AM

## 2017-09-06 NOTE — Evaluation (Signed)
Physical Therapy Evaluation Patient Details Name: Hailey Ortiz MRN: [id number] DOB: 08-22-1951 Today's Date: 09/06/2017   History of Present Illness  Patient admitted with hematoma of R leg now s/p I&D.  Pt with history of injury to the right leg, and other wounds which are healing  Clinical Impression   Patient is s/p above surgery resulting in functional limitations due to the deficits listed below (see PT Problem List). Overall moving well, at times needing cues for safety and to take things slower for line management; Pt's niece assured me that Ms. Delford FieldWright has 24 hour assistance at home;  Patient will benefit from skilled PT to increase their independence and safety with mobility to allow discharge to the venue listed below.       Follow Up Recommendations Home health PT;Supervision/Assistance - 24 hour    Equipment Recommendations  Other (comment)(pretty well-equipped)    Recommendations for Other Services OT consult     Precautions / Restrictions Precautions Precautions: Fall Precaution Comments: VAC dressing in place Restrictions RLE Weight Bearing: Weight bearing as tolerated      Mobility  Bed Mobility Overal bed mobility: Needs Assistance Bed Mobility: Supine to Sit     Supine to sit: Min guard     General bed mobility comments: Minguard for safety; cues for line management  Transfers Overall transfer level: Needs assistance Equipment used: Rolling walker (2 wheeled) Transfers: Sit to/from Stand Sit to Stand: Min guard         General transfer comment: Stood quickly, pulling up from RW; Needs reinforcement and cueing for safety and hand placement, but good rise  Ambulation/Gait Ambulation/Gait assistance: Min guard Ambulation Distance (Feet): 20 Feet Assistive device: Rolling walker (2 wheeled) Gait Pattern/deviations: Step-through pattern     General Gait Details: Stepping well with good use of RW for support; at times moving too quickly and  needing cues for line management  Stairs            Wheelchair Mobility    Modified Rankin (Stroke Patients Only)       Balance                                             Pertinent Vitals/Pain Pain Assessment: Faces Faces Pain Scale: Hurts little more Pain Location: RLE Pain Descriptors / Indicators: Aching Pain Intervention(s): Monitored during session    Home Living Family/patient expects to be discharged to:: Private residence Living Arrangements: Spouse/significant other Available Help at Discharge: Family;Available 24 hours/day Type of Home: House Home Access: Stairs to enter Entrance Stairs-Rails: None Entrance Stairs-Number of Steps: 2 or can go through garage no stairs Home Layout: One level Home Equipment: Environmental consultantWalker - 2 wheels;Shower seat      Prior Function Level of Independence: Needs assistance   Gait / Transfers Assistance Needed: RW full time  ADL's / Homemaking Assistance Needed: Family assist        Hand Dominance        Extremity/Trunk Assessment   Upper Extremity Assessment Upper Extremity Assessment: Overall WFL for tasks assessed    Lower Extremity Assessment Lower Extremity Assessment: Generalized weakness;RLE deficits/detail RLE Deficits / Details: Grossly decr AROM and strength, limited by pain and compression dressing       Communication   Communication: Expressive difficulties(expressive aphasia)  Cognition Arousal/Alertness: Awake/alert Behavior During Therapy: WFL for tasks assessed/performed Overall Cognitive Status: Within Functional Limits  for tasks assessed(for simple mobility)                                 General Comments: Tends to answer "uh-huh" to most every question      General Comments      Exercises     Assessment/Plan    PT Assessment Patient needs continued PT services  PT Problem List Decreased strength;Decreased range of motion;Decreased activity  tolerance;Decreased balance;Decreased coordination;Decreased mobility;Decreased cognition;Decreased knowledge of use of DME;Decreased safety awareness;Decreased knowledge of precautions       PT Treatment Interventions DME instruction;Gait training;Stair training;Functional mobility training;Therapeutic activities;Therapeutic exercise;Balance training;Cognitive remediation;Patient/family education    PT Goals (Current goals can be found in the Care Plan section)  Acute Rehab PT Goals Patient Stated Goal: did not state PT Goal Formulation: With patient Time For Goal Achievement: 09/20/17 Potential to Achieve Goals: Good    Frequency Min 3X/week   Barriers to discharge        Co-evaluation               AM-PAC PT "6 Clicks" Daily Activity  Outcome Measure Difficulty turning over in bed (including adjusting bedclothes, sheets and blankets)?: A Little Difficulty moving from lying on back to sitting on the side of the bed? : A Little Difficulty sitting down on and standing up from a chair with arms (e.g., wheelchair, bedside commode, etc,.)?: A Little Help needed moving to and from a bed to chair (including a wheelchair)?: A Little Help needed walking in hospital room?: A Little Help needed climbing 3-5 steps with a railing? : A Little 6 Click Score: 18    End of Session Equipment Utilized During Treatment: Gait belt Activity Tolerance: Patient tolerated treatment well Patient left: in chair;with call bell/phone within reach;with family/visitor present Nurse Communication: Mobility status PT Visit Diagnosis: Unsteadiness on feet (R26.81);Other abnormalities of gait and mobility (R26.89)    Time: [id number] PT Time Calculation (min) (ACUTE ONLY): 29 min   Charges:   PT Evaluation $PT Eval Low Complexity: 1 Low PT Treatments $Gait Training: 8-22 mins   PT G Codes:        Van ClinesHolly Makaylin Carlo, PT  Acute Rehabilitation Services Pager [phone] Office [phone]   Levi AlandHolly H  Aolani Piggott 09/06/2017, 5:38 PM

## 2017-09-06 NOTE — Care Management Note (Signed)
Case Management Note  Patient Details  Name: Hailey Ortiz MRN: [id number] Date of Birth: 1950-11-05  Subjective/Objective: spoke to this 66 y/o F re: HH/DME needs. She believes she will need RW. Plans to go to friends house at discharge. No mention of need for home Vac at present . Will await PT/OT eval                   Action/Plan:CM will follow closely for disposition/discharge needs.    Expected Discharge Date:                  Expected Discharge Plan:     In-House Referral:     Discharge planning Services  CM Consult  Post Acute Care Choice:  Durable Medical Equipment, Home Health Choice offered to:  Patient  DME Arranged:    DME Agency:     HH Arranged:    HH Agency:     Status of Service:  In process, will continue to follow  If discussed at Long Length of Stay Meetings, dates discussed:    Additional Comments:  Yvone NeuCrutchfield, Taiwo Fish M, RN 09/06/2017, 12:48 PM

## 2017-09-07 LAB — BASIC METABOLIC PANEL
Anion gap: 6 (ref 5–15)
BUN: 14 mg/dL (ref 6–20)
CHLORIDE: 101 mmol/L (ref 101–111)
CO2: 29 mmol/L (ref 22–32)
Calcium: 8.1 mg/dL — ABNORMAL LOW (ref 8.9–10.3)
Creatinine, Ser: 0.8 mg/dL (ref 0.44–1.00)
GFR calc Af Amer: >60 mL/min (ref >60)
Glucose, Bld: 90 mg/dL (ref 65–99)
POTASSIUM: 3.8 mmol/L (ref 3.5–5.1)
Sodium: 136 mmol/L (ref 135–145)

## 2017-09-07 LAB — GLUCOSE, CAPILLARY
Glucose-Capillary: 87 mg/dL (ref 65–99)
Glucose-Capillary: 109 mg/dL — ABNORMAL HIGH (ref 65–99)

## 2017-09-07 NOTE — Progress Notes (Signed)
Subjective: 2 Days Post-Op Procedure(s) (LRB): RIGHT LEG DEBRIDEMENT OF ULCER, APPLY WOUND VAC (Right) Patient reports pain as mild.    Objective: Vital signs in last 24 hours: Temp:  [98.6 F (37 C)-98.9 F (37.2 C)] 98.6 F (37 C) (12/02 0543) Pulse Rate:  [62-76] 72 (12/02 0905) Resp:  [16-18] 16 (12/02 0543) BP: (98-110)/(39-48) 99/46 (12/02 0905) SpO2:  [97 %-99 %] 99 % (12/02 0543)  Intake/Output from previous day: 12/01 0701 - 12/02 0700 In: 929.8 [P.O.:480; I.V.:249.8; IV Piggyback:200] Out: 7 [Urine:4; Stool:3] Intake/Output this shift: Total I/O In: 240 [P.O.:240] Out: 1 [Urine:1]  Recent Labs    09/05/17 0900  HGB 9.5*   Recent Labs    09/05/17 0900  WBC 7.3  RBC 3.73*  HCT 31.6*  PLT 343   Recent Labs    09/06/17 0617 09/07/17 0535  NA 137 136  K 3.5 3.8  CL 102 101  CO2 29 29  BUN 17 14  CREATININE 0.88 0.80  GLUCOSE 109* 90  CALCIUM 8.4* 8.1*   No results for input(s): LABPT, INR in the last 72 hours.  Sensation intact distally Compartment soft  Assessment/Plan: 2 Days Post-Op Procedure(s) (LRB): RIGHT LEG DEBRIDEMENT OF ULCER, APPLY WOUND VAC (Right) Cultures growing E. Coli sensitive to Ancef. Continue IV Ancef  Valeria Batmaneter W Nakhia Levitan 09/07/2017, 10:45 AM

## 2017-09-07 NOTE — Evaluation (Signed)
Occupational Therapy [facility] Patient Details Name: Hailey Ortiz MRN: [id number] DOB: 11-30-1950 Today's Date: 09/07/2017    History of Present Illness  Patient admitted with hematoma of R leg now s/p I&D.  Pt with history of injury to the right leg, and other wounds which are healing in addition to medical history including Confusion, Congenital abnormality of kidney, GERD, Headache, Hematoma, Hypercholesteremia, Hypertension, and Venous stasis ulcer. Neurology working on ruling out possibility of frontotemporal dementia (not diagnosed).   Clinical Impression   PTA Pt getting assist from family for ADL and independent in mobility (unclear if DME was needed as Pt not a reliable historian). Suspect that Pt is at baseline for ADL as she was able to ambulate to bathroom, perform sink level grooming with cues for problem solving and sequencing, and just needed assist for line management. Pt very anxious at the end of session as asking about her "remote for her legs" Strongly recommend an SLP eval for cognition/expressive difficulties to assist with communicating wants and needs. Pt verbalizing very little during session except for "uh huh" and "remote". Recommending no further OT follow up in the acute setting, but [facility], and to work with family/caregivers on independence and safety for ADL and functional transfers in her own home setting.     Follow Up Recommendations  Home health OT;Supervision/Assistance - 24 hour    Equipment Recommendations  None recommended by OT    Recommendations for Other Services Speech consult(cognitive/language eval)     Precautions / Restrictions Precautions Precautions: Fall Precaution Comments: VAC dressing in place Restrictions Weight Bearing Restrictions: Yes RLE Weight Bearing: Weight bearing as tolerated      Mobility Bed Mobility Overal bed mobility: Needs Assistance Bed Mobility: Supine to  Sit     Supine to sit: Min guard     General bed mobility comments: Minguard for safety; cues for line management  Transfers Overall transfer level: Needs assistance Equipment used: Rolling walker (2 wheeled);None Transfers: Sit to/from Stand Sit to Stand: Min guard         General transfer comment: Stood quickly, pulling up from RW; Needs reinforcement and cueing for safety and hand placement, but good rise    Balance Overall balance assessment: No apparent balance deficits (not formally assessed)                                         ADL either performed or assessed with clinical judgement   ADL Overall ADL's : At baseline                                       General ADL Comments: extra assist needed only for management of lines - no physical assist needed for management of ADL     Vision         Perception     Praxis      Pertinent Vitals/Pain Pain Assessment: 0-10 Pain Score: 10-Worst pain ever Pain Location: RLE Pain Descriptors / Indicators: Aching Pain Intervention(s): Monitored during session;Repositioned;Patient requesting pain meds-RN notified;Ice applied     Hand Dominance     Extremity/Trunk Assessment Upper Extremity Assessment Upper Extremity Assessment: Overall WFL for tasks assessed   Lower Extremity Assessment Lower Extremity Assessment: Defer to PT evaluation  Communication Communication Communication: Expressive difficulties(expressive aphasia)   Cognition Arousal/Alertness: Awake/alert Behavior During Therapy: Anxious;Agitated Overall Cognitive Status: No family/caregiver present to determine baseline cognitive functioning Area of Impairment: Safety/judgement;Problem solving                         Safety/Judgement: Decreased awareness of safety;Decreased awareness of deficits   Problem Solving: Slow processing;Decreased initiation;Requires verbal cues;Difficulty  sequencing General Comments: Tends to answer "uh-huh" to most every question; hyper focused at the end of session about finding a "remote for her leg" Pt became more and more upset crying and yelling for her remote. Pt was eventually transferred back to bed, and with assist from familiar RN (to patient) she was calmed down.    General Comments       Exercises     Shoulder Instructions      Home Living Family/patient expects to be discharged to:: Private residence Living Arrangements: Spouse/significant other Available Help at Discharge: Family;Available 24 hours/day(daughter is RN) Type of Home: House Home Access: Stairs to enter Enter[employer] CorporationEntrance Stairs-Number of Steps: 2 or can go through garage no stairs Entrance Stairs-Rails: None Home Layout: One level     Bathroom Shower/Tub: Chief Strategy OfficerTub/shower unit   Bathroom Toilet: Standard     Home Equipment: Environmental consultantWalker - 2 wheels;Shower seat          Prior Functioning/Environment Level of Independence: Needs assistance    ADL's / Homemaking Assistance Needed: Family assist   Comments: no family present to confirm, reports that she does not use RW with OT, while yesterday she reports that she uses it all the time        OT Problem List: Decreased safety awareness;Decreased knowledge of use of DME or AE;Pain      OT Treatment/Interventions:      OT Goals(Current goals can be found in the care plan section) Acute Rehab OT Goals Patient Stated Goal: did not state OT Goal Formulation: Patient unable to participate in goal setting  OT Frequency:     Barriers to D/C:            Co-evaluation              AM-PAC PT "6 Clicks" Daily Activity     Outcome Measure Help from another person eating meals?: None Help from another person taking care of personal grooming?: A Little Help from another person toileting, which includes using toliet, bedpan, or urinal?: A Little Help from another person bathing (including washing, rinsing,  drying)?: A Little Help from another person to put on and taking off regular upper body clothing?: None Help from another person to put on and taking off regular lower body clothing?: A Lot 6 Click Score: 19   End of Session Equipment Utilized During Treatment: Gait belt;Rolling walker Nurse Communication: Mobility status;Patient requests pain meds  Activity Tolerance: Patient tolerated treatment well Patient left: in bed;with call bell/phone within reach;with bed alarm set;with nursing/sitter in room;with SCD's reapplied  OT Visit Diagnosis: Other symptoms and signs involving cognitive function;Cognitive communication deficit (R41.841);Pain Pain - Right/Left: Right Pain - part of body: Leg                Time: [id number] OT Time Calculation (min): 33 min Charges:  OT General Charges $OT Visit: 1 Visit OT Evaluation $OT Eval Moderate Complexity: 1 Mod OT Treatments $Self Care/Home Management : 8-22 mins G-Codes:     Sherryl MangesLaura Eual Lindstrom OTR/L [phone]  Evern BioLaura J Marky Buresh 09/07/2017, 12:06 PM

## 2017-09-08 LAB — BASIC METABOLIC PANEL
ANION GAP: 4 — AB (ref 5–15)
BUN: 14 mg/dL (ref 6–20)
CHLORIDE: 102 mmol/L (ref 101–111)
CO2: 32 mmol/L (ref 22–32)
Calcium: 8.5 mg/dL — ABNORMAL LOW (ref 8.9–10.3)
Creatinine, Ser: 0.76 mg/dL (ref 0.44–1.00)
GFR calc Af Amer: >60 mL/min (ref >60)
Glucose, Bld: 94 mg/dL (ref 65–99)
POTASSIUM: 4.5 mmol/L (ref 3.5–5.1)
SODIUM: 138 mmol/L (ref 135–145)

## 2017-09-08 LAB — GLUCOSE, CAPILLARY: GLUCOSE-CAPILLARY: 95 mg/dL (ref 65–99)

## 2017-09-08 MED ORDER — HYDROCODONE-ACETAMINOPHEN 5-325 MG PO TABS: 1.0000 | ORAL_TABLET | ORAL | 0 refills | Status: AC | PRN

## 2017-09-08 MED ORDER — CEPHALEXIN 500 MG PO CAPS: 500.0000 mg | ORAL_CAPSULE | Freq: Three times a day (TID) | ORAL | 0 refills | Status: DC

## 2017-09-08 NOTE — Progress Notes (Signed)
RN placed prevena wound vac per MD order.

## 2017-09-08 NOTE — Care Management Note (Addendum)
Case Management Note  Patient Details  Name: Hailey Ortiz MRN: [id number] Date of Birth: 05/05/1951  Subjective/Objective:     I&D of right leg ulcer, Prevena Wound Vac               Action/Plan: Discharge Planning: Please see previous NCM notes. Contacted [facility] for RW for home. Follow up with attending and no HH needed. Will follow up in office with Dr Lajoyce Cornersuda. NCM spoke to dtr, Melody. States they will need HHRN after Chester Holsteinrevena is removed to assist with getting supplies covered by insurance. Pt's husband states she has RW and bedside commode at home.   PCP  Kathlee NationsEASON, PAUL MD  Expected Discharge Date:  09/08/17               Expected Discharge Plan:  Home/Self Care  In-House Referral:  NA  Discharge planning Services  CM Consult  Post Acute Care Choice:  NA Choice offered to:  NA  DME Arranged:  Walker rolling DME Agency:  [facility]  HH Arranged:  NA HH Agency:  NA  Status of Service:  Completed, signed off  If discussed at Long Length of Stay Meetings, dates discussed:    Additional Comments:  Elliot CousinShavis, Matisha Termine Ellen, RN 09/08/2017, 9:41 AM

## 2017-09-08 NOTE — Care Management Important Message (Signed)
Important Message  Patient Details  Name: Hailey Ortiz MRN: [id number] Date of Birth: [birth year]   Medicare Important Message Given:  Yes    Elliot CousinShavis, Kaushal Vannice Ellen, RN 09/08/2017, 10:12 AM

## 2017-09-08 NOTE — Progress Notes (Signed)
Physical Therapy Treatment Patient Details Name: Hailey Ortiz MRN: [id number] DOB: 10/16/1950 Today's Date: 09/08/2017    History of Present Illness  Patient admitted with hematoma of R leg now s/p I&D.  Pt with history of injury to the right leg, and other wounds which are healing in addition to medical history including Confusion, Congenital abnormality of kidney, GERD, Headache, Hematoma, Hypercholesteremia, Hypertension, and Venous stasis ulcer. Neurology working on ruling out possibility of frontotemporal dementia (not diagnosed).    PT Comments    Patient received supine in bed and willing to participate in skilled PT services; she continues to mostly state "uh-huh" to most questions asked by PT today. Patient generally S for all functional mobility however does require cues and occasional physical intervention by PT to maintain integrity and safety of wound vac line. Began to ambulate in room with rolling walker, but patient fairly impulsive and with poor safety awareness, requiring Mod intervention from PT and Mod-Max cues to slow down and safely move through environment/manage wound vac line, all likely due to cognitive status. Patient able to toilet with S, cues and Min-Mod assist to maintain safety of wound vac line. Patient left in bed with husband in the room, all needs met and concerns/questions addressed.    Follow Up Recommendations  Home health PT;Supervision/Assistance - 24 hour     Equipment Recommendations  Other (comment)(well equipped )    Recommendations for Other Services OT consult     Precautions / Restrictions Precautions Precautions: Fall Precaution Comments: VAC dressing in place Restrictions Weight Bearing Restrictions: Yes RLE Weight Bearing: Weight bearing as tolerated    Mobility  Bed Mobility Overal bed mobility: Needs Assistance Bed Mobility: Supine to Sit;Sit to Supine     Supine to sit: Supervision Sit to supine: Supervision   General bed  mobility comments: supervision for line safety/to maintain integrity of wound vac line   Transfers Overall transfer level: Needs assistance Equipment used: None;Rolling walker (2 wheeled) Transfers: Sit to/from Stand Sit to Stand: Supervision         General transfer comment: supervision to maintain safety and integrity of wound vac line   Ambulation/Gait Ambulation/Gait assistance: Min guard Ambulation Distance (Feet): 20 Feet(in the room ) Assistive device: Rolling walker (2 wheeled);None Gait Pattern/deviations: Step-through pattern     General Gait Details: started with RW however patient pushed this to the side and remains stable so continued with no device; needs cues for safety and line management    Stairs            Wheelchair Mobility    Modified Rankin (Stroke Patients Only)       Balance Overall balance assessment: No apparent balance deficits (not formally assessed)                                          Cognition Arousal/Alertness: Awake/alert Behavior During Therapy: Restless;Impulsive Overall Cognitive Status: Within Functional Limits for tasks assessed Area of Impairment: Safety/judgement;Problem solving                         Safety/Judgement: Decreased awareness of safety;Decreased awareness of deficits   Problem Solving: Slow processing;Decreased initiation;Requires verbal cues;Difficulty sequencing General Comments: tends to answer "uh-huh" for most questions; difficulty with command following, requires PT intervention to maintain safety/integrity of wound vac       Exercises        General Comments        Pertinent Vitals/Pain Pain Assessment: No/denies pain    Home Living Family/patient expects to be discharged to:: Private residence Living Arrangements: Spouse/significant other Available Help at Discharge: Family;Available 24 hours/day(daughter is RN ) Type of Home: House Home Access: Stairs to  enter            Prior Function            PT Goals (current goals can now be found in the care plan section) Acute Rehab PT Goals Patient Stated Goal: did not state PT Goal Formulation: With patient Time For Goal Achievement: 09/20/17 Potential to Achieve Goals: Good Progress towards PT goals: Progressing toward goals    Frequency    Min 3X/week      PT Plan      Co-evaluation              AM-PAC PT "6 Clicks" Daily Activity  Outcome Measure  Difficulty turning over in bed (including adjusting bedclothes, sheets and blankets)?: None Difficulty moving from lying on back to sitting on the side of the bed? : None Difficulty sitting down on and standing up from a chair with arms (e.g., wheelchair, bedside commode, etc,.)?: None Help needed moving to and from a bed to chair (including a wheelchair)?: A Little Help needed walking in hospital room?: A Little Help needed climbing 3-5 steps with a railing? : A Little 6 Click Score: 21    End of Session   Activity Tolerance: Patient tolerated treatment well Patient left: in bed;with family/visitor present   PT Visit Diagnosis: Unsteadiness on feet (R26.81);Other abnormalities of gait and mobility (R26.89)     Time: 5732-2025 PT Time Calculation (min) (ACUTE ONLY): 15 min  Charges:  $Therapeutic Activity: 8-22 mins                    G Codes:       Deniece Ree PT, DPT, CBIS  Supplemental Physical Therapist [place]

## 2017-09-08 NOTE — Progress Notes (Signed)
Pt discharge instructions reviewed with pt and husband. Pt and husband verbalize understanding. Pt belongings with pt. Pt is not in distress. Pt discharged via wheelchair. Pt's husband is driving her home. 

## 2017-09-08 NOTE — Discharge Summary (Signed)
Discharge Diagnoses:  Active Problems:   Leg hematoma, right, sequela   Surgeries: Procedure(s): RIGHT LEG DEBRIDEMENT OF ULCER, APPLY WOUND VAC on 09/05/2017    Consultants:   Discharged Condition: Improved  Hospital Course: Hailey Ortiz is an 66 y.o. female who was admitted 09/05/2017 with a chief complaint of new hematoma right leg, with a final diagnosis of Abscess/Ulcer Right Leg.  Patient was brought to the operating room on 09/05/2017 and underwent Procedure(s): RIGHT LEG DEBRIDEMENT OF ULCER, APPLY WOUND VAC.    Patient was given perioperative antibiotics:  Anti-infectives (From admission, onward)   Start     Dose/Rate Route Frequency Ordered Stop   09/08/17 0000  cephALEXin (KEFLEX) 500 MG capsule     500 mg Oral 3 times daily 09/08/17 0617     09/05/17 1800  ceFAZolin (ANCEF) IVPB 1 g/50 mL premix    Comments:  Cultures pending large deep hematoma concerning for infection, will continue kefzol until cultures are finalized.   1 g 100 mL/hr over 30 Minutes Intravenous Every 8 hours 09/05/17 1245 09/10/17 1759   09/05/17 0900  ceFAZolin (ANCEF) IVPB 2g/100 mL premix     2 g 200 mL/hr over 30 Minutes Intravenous On call to O.R. 09/05/17 [id number] 09/05/17 [id number]    .  Patient was given sequential compression devices, early ambulation, and aspirin for DVT prophylaxis.  Recent vital signs:  Patient Vitals for the past 24 hrs:  BP Temp Temp src Pulse Resp SpO2  09/08/17 0552 (!) 116/56 98.6 F (37 C) Oral 62 16 100 %  09/07/17 1300 (!) 110/50 98.1 F (36.7 C) Oral 82 16 95 %  09/07/17 0905 (!) 99/46 - - 72 - -  .  Recent laboratory studies: No results found.  Discharge Medications:   Allergies as of 09/08/2017   No Known Allergies     Medication List    STOP taking these medications   doxycycline 100 MG tablet Commonly known as:  VIBRA-TABS     TAKE these medications   aspirin EC 81 MG tablet Take 81 mg by mouth every evening.   calcium-vitamin D 500-200  MG-UNIT tablet Take 1 tablet by mouth at bedtime.   cephALEXin 500 MG capsule Commonly known as:  KEFLEX Take 1 capsule (500 mg total) by mouth 3 (three) times daily.   cholecalciferol 1000 units tablet Commonly known as:  VITAMIN D Take 1,000 Units by mouth daily.   cyanocobalamin 1000 MCG/ML injection Commonly known as:  (VITAMIN B-12) Inject 1,000 mcg into the muscle every Thursday.   famotidine 20 MG tablet Commonly known as:  PEPCID Take 20 mg by mouth at bedtime.   folic acid 400 MCG tablet Commonly known as:  FOLVITE Take 400 mcg by mouth daily.   furosemide 20 MG tablet Commonly known as:  LASIX Take 20 mg by mouth daily.   HYDROcodone-acetaminophen 5-325 MG tablet Commonly known as:  NORCO/VICODIN Take 1 tablet every 6 (six) hours as needed by mouth for moderate pain. What changed:  Another medication with the same name was added. Make sure you understand how and when to take each.   HYDROcodone-acetaminophen 5-325 MG tablet Commonly known as:  NORCO/VICODIN Take 1 tablet by mouth every 4 (four) hours as needed for moderate pain ((score 4 to 6)). What changed:  You were already taking a medication with the same name, and this prescription was added. Make sure you understand how and when to take each.   hydrOXYzine 10 MG tablet Commonly known as:  ATARAX/VISTARIL Take 10 mg by mouth 3 (three) times daily as needed for anxiety.   metoprolol succinate 50 MG 24 hr tablet Commonly known as:  TOPROL-XL Take 12.5 mg by mouth daily.   multivitamin with minerals Tabs tablet Take 1 tablet by mouth daily.   PROBIOTIC PO Take 1 capsule by mouth daily.   sertraline 100 MG tablet Commonly known as:  ZOLOFT Take 100 mg by mouth at bedtime.   simvastatin 40 MG tablet Commonly known as:  ZOCOR Take 40 mg by mouth at bedtime.            Durable Medical Equipment  (From admission, onward)        Start     Ordered   09/07/17 1803  For home use only DME  Walker rolling  Once    Question:  Patient needs a walker to treat with the following condition  Answer:  Abscess of right leg   09/07/17 1803       Discharge Care Instructions  (From admission, onward)        Start     Ordered   09/08/17 0000  Weight bearing as tolerated    Question Answer Comment  Laterality bilateral   Extremity Lower      09/08/17 0616      Diagnostic Studies: No results found.  Patient benefited maximally from their hospital stay and there were no complications.     Disposition: 01-Home or Self Care Discharge Instructions    Call MD / Call 911   Complete by:  As directed    If you experience chest pain or shortness of breath, CALL 911 and be transported to the hospital emergency room.  If you develope a fever above 101 F, pus (white drainage) or increased drainage or redness at the wound, or calf pain, call your surgeon's office.   Constipation Prevention   Complete by:  As directed    Drink plenty of fluids.  Prune juice may be helpful.  You may use a stool softener, such as Colace (over the counter) 100 mg twice a day.  Use MiraLax (over the counter) for constipation as needed.   Diet - low sodium heart healthy   Complete by:  As directed    Elevate operative extremity   Complete by:  As directed    Increase activity slowly as tolerated   Complete by:  As directed    Weight bearing as tolerated   Complete by:  As directed    Laterality:  bilateral   Extremity:  Lower     Follow-up Information    Nadara Mustarduda, Hailey Gladu V, MD Follow up in 1 week(s).   Specialty:  Orthopedic Surgery Contact information: [address] [place] [address] [phone]            Signed: Nadara MustardMarcus V Evanie Buckle 09/08/2017, 6:19 AM

## 2017-09-08 NOTE — Progress Notes (Signed)
Patient ID: Hailey Ortiz, female   DOB: [birth year], 66 y.o.   MRN: [id number] Cultures positive for e coli, sensitive to keflex, plan for discharge today, continue keflex

## 2017-09-10 LAB — AEROBIC/ANAEROBIC CULTURE (SURGICAL/DEEP WOUND)

## 2017-09-10 LAB — AEROBIC/ANAEROBIC CULTURE W GRAM STAIN (SURGICAL/DEEP WOUND)

## 2017-09-16 ENCOUNTER — Encounter (INDEPENDENT_AMBULATORY_CARE_PROVIDER_SITE_OTHER): Payer: Self-pay | Admitting: Orthopedic Surgery

## 2017-09-16 ENCOUNTER — Ambulatory Visit (INDEPENDENT_AMBULATORY_CARE_PROVIDER_SITE_OTHER): Payer: Medicare Other | Admitting: Orthopedic Surgery

## 2017-09-16 DIAGNOSIS — S8011XS Contusion of right lower leg, sequela: Secondary | ICD-10-CM

## 2017-09-16 MED ORDER — SULFAMETHOXAZOLE-TRIMETHOPRIM 800-160 MG PO TABS: 1.0000 | ORAL_TABLET | Freq: Two times a day (BID) | ORAL | 0 refills | Status: AC

## 2017-09-16 NOTE — Progress Notes (Signed)
Office Visit Note   Patient: Hailey Ortiz           Date of Birth: [birth year]           MRN: [id number] Visit Date: 09/16/2017              Requested by: Kathlee NationsEason, Paul, MD [address] [place], [place] [id number] PCP: Kathlee NationsEason, Paul, MD  No chief complaint on file.     HPI: Patient is seen in follow-up status post infected hematomas most recent cultures were positive for E. coli.  Patient has completed a course of doxycycline.  The E. coli was pansensitive.  The dressing is removed patient is leaving the wound with her fingers.  Assessment & Plan: Visit Diagnoses:  1. Hematoma of leg, right, sequela     Plan: We will call in a prescription for Bactrim DS.  The family will wash the leg with soap and water daily use a shower nozzle for cleansing 4 x 4's plus an Ace wrap to keep this covered and continue with elevation.  Follow-Up Instructions: Return in about 1 week (around 09/23/2017).   Ortho Exam  Patient is alert, oriented, no adenopathy, well-dressed, normal affect, normal respiratory effort. Examination the incisions are healing nicely the proximal thigh wound and the lateral calf wound are healing nicely the medial calf wound has one area that is open about a centimeter in diameter.  With compression a hematoma was expressed from the wound.  This was approximately 10 cc of a hematoma wound.  A sterile compression dressing was applied.  Imaging: No results found. No images are attached to the encounter.  Labs: Lab Results  Component Value Date   REPTSTATUS 09/10/2017 FINAL 09/05/2017   GRAMSTAIN  09/05/2017    MODERATE WBC PRESENT, PREDOMINANTLY PMN FEW GRAM NEGATIVE RODS    CULT MODERATE ESCHERICHIA COLI NO ANAEROBES ISOLATED  09/05/2017   LABORGA ESCHERICHIA COLI 09/05/2017    @LABSALLVALUES (HGBA1)@  There is no height or weight on file to calculate BMI.  Orders:  No orders of the defined types were placed in this encounter.  Meds ordered this  encounter  Medications  . sulfamethoxazole-trimethoprim (BACTRIM DS,SEPTRA DS) 800-160 MG tablet    Sig: Take 1 tablet by mouth 2 (two) times daily.    Dispense:  60 tablet    Refill:  0     Procedures: No procedures performed  Clinical Data: No additional findings.  ROS:  All other systems negative, except as noted in the HPI. Review of Systems  Objective: Vital Signs: There were no vitals taken for this visit.  Specialty Comments:  No specialty comments available.  PMFS History: Patient Active Problem List   Diagnosis Date Noted  . Leg hematoma, right, sequela 09/05/2017  . 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 Degloving injury of lower leg, right, sequela 08/03/2017    Class: Chronic  . Hematoma 08/03/2017  . Degloving injury of right lower leg 08/03/2017  . Abscess of knee   . Hematoma of leg, right, sequela   . Cellulitis 07/18/2017  . Right leg swelling 07/18/2017  . Anemia 07/18/2017  . HTN (hypertension) 07/18/2017  . Hyperlipidemia 07/18/2017   Past Medical History:  Diagnosis Date  . Confusion   . Congenital abnormality of kidney   . GERD (gastroesophageal reflux disease)   . Headache    migraine  . Hematoma    right leg  . Hypercholesteremia   . Hypertension   .  Venous stasis ulcer (HCC)     Family History  Problem Relation Age of Onset  . Heart failure Mother   . Kidney Stones Mother   . Macular degeneration Mother   . Cancer Mother   . Rectal cancer Father   . Arthritis/Rheumatoid Father   . Colon cancer Father   . Cancer Father     Past Surgical History:  Procedure Laterality Date  . ABDOMINAL HYSTERECTOMY     total  . APPENDECTOMY    . CHOLECYSTECTOMY    . I&D EXTREMITY Right 07/23/2017   Procedure: DEBRIDEMENT RIGHT KNEE WOUND;  Surgeon: Nadara Mustarduda, Aaliayah Miao V, MD;  [facility];  Service: Orthopedics;  Laterality: Right;  . I&D EXTREMITY Right 07/25/2017    Procedure: REPEAT DEBRIDEMENT RIGHT KNEE WOUND;  Surgeon: Nadara Mustarduda, Ledarius Leeson V, MD;  [facility];  Service: Orthopedics;  Laterality: Right;  . I&D EXTREMITY Right 08/06/2017   Procedure: RIGHT LEG DEBRIDEMENT HEMATOMA;  Surgeon: Nadara Mustarduda, Dorissa Stinnette V, MD;  [facility];  Service: Orthopedics;  Laterality: Right;  . I&D EXTREMITY Right 09/05/2017   leg & wound vac placed/notes 09/05/2017  . I&D EXTREMITY Right 09/05/2017   Procedure: RIGHT LEG DEBRIDEMENT OF ULCER, APPLY WOUND VAC;  Surgeon: Nadara Mustarduda, Evita Merida V, MD;  Location: [facility] OR;  Service: Orthopedics;  Laterality: Right;   Social History   Occupational History    Comment: retired,  Forensic psychologistfurniture showroom  Tobacco Use  . Smoking status: Never Smoker  . Smokeless tobacco: Never Used  Substance and Sexual Activity  . Alcohol use: No  . Drug use: No  . Sexual activity: Not on file

## 2017-09-23 ENCOUNTER — Encounter (INDEPENDENT_AMBULATORY_CARE_PROVIDER_SITE_OTHER): Payer: Self-pay | Admitting: Orthopedic Surgery

## 2017-09-23 ENCOUNTER — Ambulatory Visit (INDEPENDENT_AMBULATORY_CARE_PROVIDER_SITE_OTHER): Payer: Medicare Other | Admitting: Orthopedic Surgery

## 2017-09-23 DIAGNOSIS — S8011XS Contusion of right lower leg, sequela: Secondary | ICD-10-CM

## 2017-09-23 NOTE — Progress Notes (Signed)
Office Visit Note   Patient: Hailey Ortiz           Date of Birth: 03/07/1951           MRN: [id number] Visit Date: 09/23/2017              Requested by: Kathlee NationsEason, Paul, MD [address] [place], [place] [id number] PCP: Kathlee NationsEason, Paul, MD  Chief Complaint  Patient presents with  . Right Leg - Follow-up    09/05/17 Right Leg Debridement with hematoma      HPI: Patient presents in follow-up for debridement of multiple hematomas right lower extremity.  Patient has no complaints at this time.  Assessment & Plan: Visit Diagnoses:  1. Hematoma of leg, right, sequela     Plan: Patient has made remarkable improvement we will harvest the remaining sutures today she will wear knee-high 15-20 mm compression stockings around the clock she will change these daily wash her leg with soap and water daily  Follow-Up Instructions: Return in about 2 weeks (around 10/07/2017).   Ortho Exam  Patient is alert, oriented, no adenopathy, well-dressed, normal affect, normal respiratory effort. .  Examination patient's leg looks excellent the wounds have healed well the distal resolving hematoma has resolved there is an area of granulation tissue about 5 mm in diameter which has no drainage no cellulitis.  The incisions are well-healed we will harvest the sutures.  Imaging: No results found. No images are attached to the encounter.  Labs: Lab Results  Component Value Date   REPTSTATUS 09/10/2017 FINAL 09/05/2017   GRAMSTAIN  09/05/2017    MODERATE WBC PRESENT, PREDOMINANTLY PMN FEW GRAM NEGATIVE RODS    CULT MODERATE ESCHERICHIA COLI NO ANAEROBES ISOLATED  09/05/2017   LABORGA ESCHERICHIA COLI 09/05/2017    @LABSALLVALUES (HGBA1)@  There is no height or weight on file to calculate BMI.  Orders:  No orders of the defined types were placed in this encounter.  No orders of the defined types were placed in this encounter.    Procedures: No procedures performed  Clinical Data: No  additional findings.  ROS:  All other systems negative, except as noted in the HPI. Review of Systems  Objective: Vital Signs: There were no vitals taken for this visit.  Specialty Comments:  No specialty comments available.  PMFS History: Patient Active Problem List   Diagnosis Date Noted  . Leg hematoma, right, sequela 09/05/2017  . 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 Degloving injury of lower leg, right, sequela 08/03/2017    Class: Chronic  . Hematoma 08/03/2017  . Degloving injury of right lower leg 08/03/2017  . Abscess of knee   . Hematoma of leg, right, sequela   . Cellulitis 07/18/2017  . Right leg swelling 07/18/2017  . Anemia 07/18/2017  . HTN (hypertension) 07/18/2017  . Hyperlipidemia 07/18/2017   Past Medical History:  Diagnosis Date  . Confusion   . Congenital abnormality of kidney   . GERD (gastroesophageal reflux disease)   . Headache    migraine  . Hematoma    right leg  . Hypercholesteremia   . Hypertension   . Venous stasis ulcer (HCC)     Family History  Problem Relation Age of Onset  . Heart failure Mother   . Kidney Stones Mother   . Macular degeneration Mother   . Cancer Mother   . Rectal cancer Father   . Arthritis/Rheumatoid Father   . Colon cancer Father   .  Cancer Father     Past Surgical History:  Procedure Laterality Date  . ABDOMINAL HYSTERECTOMY     total  . APPENDECTOMY    . CHOLECYSTECTOMY    . I&D EXTREMITY Right 07/23/2017   Procedure: DEBRIDEMENT RIGHT KNEE WOUND;  Surgeon: Nadara Mustarduda, Tangela Dolliver V, MD;  [facility];  Service: Orthopedics;  Laterality: Right;  . I&D EXTREMITY Right 07/25/2017   Procedure: REPEAT DEBRIDEMENT RIGHT KNEE WOUND;  Surgeon: Nadara Mustarduda, Brockton Mckesson V, MD;  [facility];  Service: Orthopedics;  Laterality: Right;  . I&D EXTREMITY Right 08/06/2017   Procedure: RIGHT LEG DEBRIDEMENT HEMATOMA;  Surgeon: Nadara Mustarduda, Shontelle Muska V, MD;   [facility];  Service: Orthopedics;  Laterality: Right;  . I&D EXTREMITY Right 09/05/2017   leg & wound vac placed/notes 09/05/2017  . I&D EXTREMITY Right 09/05/2017   Procedure: RIGHT LEG DEBRIDEMENT OF ULCER, APPLY WOUND VAC;  Surgeon: Nadara Mustarduda, Sharesa Kemp V, MD;  [facility]: [facility] OR;  Service: Orthopedics;  Laterality: Right;   Social History   Occupational History    Comment: retired,  Forensic psychologistfurniture showroom  Tobacco Use  . Smoking status: Never Smoker  . Smokeless tobacco: Never Used  Substance and Sexual Activity  . Alcohol use: No  . Drug use: No  . Sexual activity: Not on file

## 2017-10-08 ENCOUNTER — Ambulatory Visit (INDEPENDENT_AMBULATORY_CARE_PROVIDER_SITE_OTHER): Payer: Medicare Other | Admitting: Orthopedic Surgery

## 2017-10-13 ENCOUNTER — Encounter (INDEPENDENT_AMBULATORY_CARE_PROVIDER_SITE_OTHER): Payer: Self-pay | Admitting: Family

## 2017-10-13 ENCOUNTER — Ambulatory Visit (INDEPENDENT_AMBULATORY_CARE_PROVIDER_SITE_OTHER): Payer: Medicare Other | Admitting: Family

## 2017-10-13 DIAGNOSIS — L97919 Non-pressure chronic ulcer of unspecified part of right lower leg with unspecified severity: Secondary | ICD-10-CM

## 2017-10-13 DIAGNOSIS — S81801S Unspecified open wound, right lower leg, sequela: Secondary | ICD-10-CM

## 2017-10-13 DIAGNOSIS — I87331 Chronic venous hypertension (idiopathic) with ulcer and inflammation of right lower extremity: Secondary | ICD-10-CM

## 2017-10-13 DIAGNOSIS — S8011XS Contusion of right lower leg, sequela: Secondary | ICD-10-CM

## 2017-10-13 NOTE — Progress Notes (Signed)
Office Visit Note   Patient: Hailey Ortiz           Date of Birth: 23-Aug-1951           MRN: [id number] Visit Date: 10/13/2017              Requested by: Kathlee NationsEason, Paul, MD [address] [place], [place] [id number] PCP: Kathlee NationsEason, Paul, MD  No chief complaint on file.     HPI: Patient presents in follow-up for debridement of multiple hematomas right lower extremity.  Patient has no complaints at this time. In Vive compression stockings, one layer, bilaterally.  Assessment & Plan: Visit Diagnoses:  1. Hematoma of leg, right, sequela   2. Degloving injury of lower leg, right, sequela   3. 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   Ortho Exam  Patient is alert, oriented, no adenopathy, well-dressed, normal affect, normal respiratory effort. Examination patient's leg looks excellent the wounds have healed well. the distal hematoma has resolved. no drainage no cellulitis.  The incisions are well-healed. Trace edema bilateral LEs.  Imaging: No results found. No images are attached to the encounter.  Labs: Lab Results  Component Value Date   REPTSTATUS 09/10/2017 FINAL 09/05/2017   GRAMSTAIN  09/05/2017    MODERATE WBC PRESENT, PREDOMINANTLY PMN FEW GRAM NEGATIVE RODS    CULT MODERATE ESCHERICHIA COLI NO ANAEROBES ISOLATED  09/05/2017   LABORGA ESCHERICHIA COLI 09/05/2017    @LABSALLVALUES (HGBA1)@  There is no height or weight on file to calculate BMI.  Orders:  No orders of the defined types were placed in this encounter.  No orders of the defined types were placed in this encounter.    Procedures: No  procedures performed  Clinical Data: No additional findings.  ROS:  All other systems negative, except as noted in the HPI. Review of Systems  Constitutional: Negative for chills and fever.  Cardiovascular: Positive for leg swelling.  Skin: Negative for color change and wound.    Objective: Vital Signs: There were no vitals taken for this visit.  Specialty Comments:  No specialty comments available.  PMFS History: Patient Active Problem List   Diagnosis Date Noted  . 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 Degloving injury of lower leg, right, sequela 08/03/2017    Class: Chronic  . Abscess of knee   . Hematoma of leg, right, sequela   . Right leg swelling 07/18/2017  . Anemia 07/18/2017  . HTN (hypertension) 07/18/2017  . Hyperlipidemia 07/18/2017   Past Medical History:  Diagnosis Date  . Confusion   . Congenital abnormality of kidney   . GERD (gastroesophageal reflux disease)   . Headache    migraine  . Hematoma    right leg  . Hypercholesteremia   . Hypertension   . Venous stasis ulcer (HCC)     Family History  Problem Relation Age of Onset  . Heart failure Mother   . Kidney Stones Mother   . Macular degeneration Mother   . Cancer Mother   . Rectal cancer Father   . Arthritis/Rheumatoid Father   . Colon cancer Father   .  Cancer Father     Past Surgical History:  Procedure Laterality Date  . ABDOMINAL HYSTERECTOMY     total  . APPENDECTOMY    . CHOLECYSTECTOMY    . I&D EXTREMITY Right 07/23/2017   Procedure: DEBRIDEMENT RIGHT KNEE WOUND;  Surgeon: Nadara Mustard, MD;  Location: [facility] OR;  Service: Orthopedics;  Laterality: Right;  . I&D EXTREMITY Right 07/25/2017   Procedure: REPEAT DEBRIDEMENT RIGHT KNEE WOUND;  Surgeon: Nadara Mustard, MD;  Location: [facility] OR;  Service: Orthopedics;  Laterality: Right;  . I&D EXTREMITY Right 08/06/2017   Procedure: RIGHT LEG DEBRIDEMENT HEMATOMA;   Surgeon: Nadara Mustard, MD;  Location: [facility] OR;  Service: Orthopedics;  Laterality: Right;  . I&D EXTREMITY Right 09/05/2017   leg & wound vac placed/notes 09/05/2017  . I&D EXTREMITY Right 09/05/2017   Procedure: RIGHT LEG DEBRIDEMENT OF ULCER, APPLY WOUND VAC;  Surgeon: Nadara Mustard, MD;  Location: [facility] OR;  Service: Orthopedics;  Laterality: Right;   Social History   Occupational History    Comment: retired,  Forensic psychologist  Tobacco Use  . Smoking status: Never Smoker  . Smokeless tobacco: Never Used  Substance and Sexual Activity  . Alcohol use: No  . Drug use: No  . Sexual activity: Not on file

## 2017-12-11 ENCOUNTER — Ambulatory Visit (INDEPENDENT_AMBULATORY_CARE_PROVIDER_SITE_OTHER): Payer: Medicare Other | Admitting: Orthopedic Surgery

## 2018-08-03 ENCOUNTER — Ambulatory Visit (HOSPITAL_COMMUNITY)
Admission: EM | Admit: 2018-08-03 | Discharge: 2018-08-03 | Disposition: A | Discharge status: Home / Self Care | Payer: Medicare Other | Attending: Family Medicine | Admitting: Family Medicine

## 2018-08-03 ENCOUNTER — Telehealth (HOSPITAL_COMMUNITY): Payer: Self-pay | Admitting: Emergency Medicine

## 2018-08-03 ENCOUNTER — Encounter (HOSPITAL_COMMUNITY): Payer: Self-pay | Admitting: Family Medicine

## 2018-08-03 DIAGNOSIS — R410 Disorientation, unspecified: Secondary | ICD-10-CM

## 2018-08-03 DIAGNOSIS — F05 Delirium due to known physiological condition: Secondary | ICD-10-CM

## 2018-08-03 DIAGNOSIS — R350 Frequency of micturition: Secondary | ICD-10-CM

## 2018-08-03 HISTORY — DX: Unspecified dementia, unspecified severity, without behavioral disturbance, psychotic disturbance, mood disturbance, and anxiety: F03.90

## 2018-08-03 MED ORDER — NITROFURANTOIN MONOHYD MACRO 100 MG PO CAPS: 100.0000 mg | ORAL_CAPSULE | Freq: Two times a day (BID) | ORAL | 0 refills | Status: DC

## 2018-08-03 MED ORDER — NITROFURANTOIN MONOHYD MACRO 100 MG PO CAPS: 100.0000 mg | ORAL_CAPSULE | Freq: Two times a day (BID) | ORAL | 0 refills | Status: AC

## 2018-08-03 NOTE — ED Provider Notes (Signed)
[facility]    CSN: [id number] Arrival date & time: 08/03/18  1824     History   Chief Complaint Chief Complaint  Patient presents with  . Urinary Tract Infection    HPI Hailey Ortiz is a 67 y.o. female.   This is the first [facility] urgent care visit for this 67 year old woman complaining of urinary tract infection symptoms, specifically frequency.  She is brought in by her son and the symptoms of been reported by the daughter who is currently at work.  There is been no nausea or vomiting, no flank pain, and no known fever.  Patient's behavior is been a little bit worse in terms of cooperation.  She is demented and confused.     Past Medical History:  Diagnosis Date  . Confusion   . Congenital abnormality of kidney   . Dementia (HCC)   . GERD (gastroesophageal reflux disease)   . Headache    migraine  . Hematoma    right leg  . Hypercholesteremia   . Hypertension   . Venous stasis ulcer (HCC)     Patient Active Problem List   Diagnosis Date Noted  . 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 Degloving injury of lower leg, right, sequela 08/03/2017    Class: Chronic  . Abscess of knee   . Hematoma of leg, right, sequela   . Right leg swelling 07/18/2017  . Anemia 07/18/2017  . HTN (hypertension) 07/18/2017  . Hyperlipidemia 07/18/2017    Past Surgical History:  Procedure Laterality Date  . ABDOMINAL HYSTERECTOMY     total  . APPENDECTOMY    . CHOLECYSTECTOMY    . I&D EXTREMITY Right 07/23/2017   Procedure: DEBRIDEMENT RIGHT KNEE WOUND;  Surgeon: Nadara Mustard, MD;  Location: [facility] OR;  Service: Orthopedics;  Laterality: Right;  . I&D EXTREMITY Right 07/25/2017   Procedure: REPEAT DEBRIDEMENT RIGHT KNEE WOUND;  Surgeon: Nadara Mustard, MD;  Location: [facility] OR;  Service: Orthopedics;  Laterality: Right;  . I&D EXTREMITY Right 08/06/2017   Procedure: RIGHT LEG DEBRIDEMENT HEMATOMA;   Surgeon: Nadara Mustard, MD;  Location: [facility] OR;  Service: Orthopedics;  Laterality: Right;  . I&D EXTREMITY Right 09/05/2017   leg & wound vac placed/notes 09/05/2017  . I&D EXTREMITY Right 09/05/2017   Procedure: RIGHT LEG DEBRIDEMENT OF ULCER, APPLY WOUND VAC;  Surgeon: Nadara Mustard, MD;  Location: [facility] OR;  Service: Orthopedics;  Laterality: Right;    OB History   None      Home Medications    Prior to Admission medications   Medication Sig Start Date End Date Taking? Authorizing Provider  aspirin EC 81 MG tablet Take 81 mg by mouth every evening.    [provider]  Calcium Carb-Cholecalciferol (CALCIUM-VITAMIN D) 500-200 MG-UNIT tablet Take 1 tablet by mouth at bedtime.    [provider]  cholecalciferol (VITAMIN D) 1000 units tablet Take 1,000 Units by mouth daily.    [provider]  cyanocobalamin (,VITAMIN B-12,) 1000 MCG/ML injection Inject 1,000 mcg into the muscle every Thursday.  07/04/16   [provider]  famotidine (PEPCID) 20 MG tablet Take 20 mg by mouth at bedtime.    [provider]  folic acid (FOLVITE) 400 MCG tablet Take 400 mcg by mouth daily. 07/01/16   [provider]  furosemide (LASIX) 20 MG tablet Take 20 mg by mouth daily.    [provider]  HYDROcodone-acetaminophen (  NORCO/VICODIN) 5-325 MG tablet Take 1 tablet every 6 (six) hours as needed by mouth for moderate pain. 08/15/17   Adonis Huguenin, NP  HYDROcodone-acetaminophen (NORCO/VICODIN) 5-325 MG tablet Take 1 tablet by mouth every 4 (four) hours as needed for moderate pain ((score 4 to 6)). 09/08/17   Nadara Mustard, MD  hydrOXYzine (ATARAX/VISTARIL) 10 MG tablet Take 10 mg by mouth 3 (three) times daily as needed for anxiety.    [provider]  metoprolol succinate (TOPROL-XL) 50 MG 24 hr tablet Take 12.5 mg by mouth daily.  06/21/16   [provider]  Multiple Vitamin (MULTIVITAMIN WITH MINERALS) TABS tablet Take 1 tablet by  mouth daily.    [provider]  nitrofurantoin, macrocrystal-monohydrate, (MACROBID) 100 MG capsule Take 1 capsule (100 mg total) by mouth 2 (two) times daily. 08/03/18   Elvina Sidle, MD  Probiotic Product (PROBIOTIC PO) Take 1 capsule by mouth daily.    [provider]  sertraline (ZOLOFT) 100 MG tablet Take 100 mg by mouth at bedtime.    [provider]  simvastatin (ZOCOR) 40 MG tablet Take 40 mg by mouth at bedtime.  07/10/16   [provider]  sulfamethoxazole-trimethoprim (BACTRIM DS,SEPTRA DS) 800-160 MG tablet Take 1 tablet by mouth 2 (two) times daily. 09/16/17   Nadara Mustard, MD    Family History Family History  Problem Relation Age of Onset  . Heart failure Mother   . Kidney Stones Mother   . Macular degeneration Mother   . Cancer Mother   . Rectal cancer Father   . Arthritis/Rheumatoid Father   . Colon cancer Father   . Cancer Father     Social History Social History   Tobacco Use  . Smoking status: Never Smoker  . Smokeless tobacco: Never Used  Substance Use Topics  . Alcohol use: No  . Drug use: No     Allergies   Patient has no known allergies.   Review of Systems Review of Systems   Physical Exam Triage Vital Signs ED Triage Vitals [08/03/18 1900]  Enc Vitals Group     BP (!) 151/71     Pulse Rate 72     Resp 20     Temp 98.5 F (36.9 C)     Temp Source Oral     SpO2 100 %     Weight      Height      Head Circumference      Peak Flow      Pain Score      Pain Loc      Pain Edu?      Excl. in GC?    No data found.  Updated Vital Signs BP (!) 151/71 (BP Location: Right Arm)   Pulse 72   Temp 98.5 F (36.9 C) (Oral)   Resp 20   SpO2 100%    Physical Exam  Constitutional: She appears well-developed and well-nourished.  Eyes: Conjunctivae are normal.  Neck: Normal range of motion. Neck supple.  Pulmonary/Chest: Effort normal.  Musculoskeletal: Normal range of motion.  Neurological: She  is alert.  Patient is disoriented and wandering around the room.  After repeatedly asking her to sit down, she finally does sit down but then gets up and starts wandering around the room again.  We tried to get a urine specimen but patient was not cooperative.  Skin: Skin is warm and dry.  Nursing note and vitals reviewed.    UC Treatments /  Results  Labs (all labs ordered are listed, but only abnormal results are displayed) Labs Reviewed - No data to display  EKG None  Radiology No results found.  Procedures Procedures (including critical care time)  Medications Ordered in UC Medications - No data to display  Initial Impression / Assessment and Plan / UC Course  I have reviewed the triage vital signs and the nursing notes.  Pertinent labs & imaging results that were available during my care of the patient were reviewed by me and considered in my medical decision making (see chart for details).    Final Clinical Impressions(s) / UC Diagnoses   Final diagnoses:  Urinary frequency  Subacute confusional state     Discharge Instructions     We were not able to get a urine specimen but the symptoms are certainly consistent with a urinary tract infection.  Therefore we are going ahead and treating her with a twice a day medicine.    ED Prescriptions    Medication Sig Dispense Auth. Provider   nitrofurantoin, macrocrystal-monohydrate, (MACROBID) 100 MG capsule Take 1 capsule (100 mg total) by mouth 2 (two) times daily. 20 capsule Elvina Sidle, MD     Controlled Substance Prescriptions [place] Controlled Substance Registry consulted? Not Applicable   Elvina Sidle, MD 08/03/18 [phone]

## 2018-08-03 NOTE — Discharge Instructions (Signed)
We were not able to get a urine specimen but the symptoms are certainly consistent with a urinary tract infection.  Therefore we are going ahead and treating her with a twice a day medicine.

## 2018-08-03 NOTE — ED Triage Notes (Signed)
Pt here with family for UTI; step son unsure of sx; pt is non verbal and has dementia; attempted to obtain urine sample without success
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
## 2019-05-06 ENCOUNTER — Emergency Department (HOSPITAL_COMMUNITY)
Admission: EM | Admit: 2019-05-06 | Discharge: 2019-05-07 | Disposition: A | Discharge status: Home / Self Care | Payer: Medicare Other | Attending: Emergency Medicine | Admitting: Emergency Medicine

## 2019-05-06 ENCOUNTER — Emergency Department (HOSPITAL_COMMUNITY): Payer: Medicare Other

## 2019-05-06 ENCOUNTER — Encounter (HOSPITAL_COMMUNITY): Payer: Self-pay | Admitting: *Deleted

## 2019-05-06 DIAGNOSIS — I10 Essential (primary) hypertension: Secondary | ICD-10-CM | POA: Diagnosis not present

## 2019-05-06 DIAGNOSIS — Z7982 Long term (current) use of aspirin: Secondary | ICD-10-CM | POA: Diagnosis not present

## 2019-05-06 DIAGNOSIS — Z20828 Contact with and (suspected) exposure to other viral communicable diseases: Secondary | ICD-10-CM | POA: Diagnosis not present

## 2019-05-06 DIAGNOSIS — F0390 Unspecified dementia without behavioral disturbance: Secondary | ICD-10-CM | POA: Insufficient documentation

## 2019-05-06 DIAGNOSIS — R479 Unspecified speech disturbances: Secondary | ICD-10-CM | POA: Insufficient documentation

## 2019-05-06 DIAGNOSIS — Z79899 Other long term (current) drug therapy: Secondary | ICD-10-CM | POA: Insufficient documentation

## 2019-05-06 DIAGNOSIS — N390 Urinary tract infection, site not specified: Secondary | ICD-10-CM | POA: Insufficient documentation

## 2019-05-06 DIAGNOSIS — R55 Syncope and collapse: Secondary | ICD-10-CM | POA: Insufficient documentation

## 2019-05-06 DIAGNOSIS — R4182 Altered mental status, unspecified: Secondary | ICD-10-CM | POA: Diagnosis present

## 2019-05-06 DIAGNOSIS — I951 Orthostatic hypotension: Secondary | ICD-10-CM | POA: Insufficient documentation

## 2019-05-06 LAB — BASIC METABOLIC PANEL
Anion gap: 11 (ref 5–15)
BUN: 17 mg/dL (ref 8–23)
CO2: 21 mmol/L — ABNORMAL LOW (ref 22–32)
Calcium: 8.6 mg/dL — ABNORMAL LOW (ref 8.9–10.3)
Chloride: 104 mmol/L (ref 98–111)
Creatinine, Ser: 0.96 mg/dL (ref 0.44–1.00)
GFR calc Af Amer: >60 mL/min (ref >60)
GFR calc non Af Amer: >60 mL/min (ref >60)
Glucose, Bld: 91 mg/dL (ref 70–99)
Potassium: 4.2 mmol/L (ref 3.5–5.1)
Sodium: 136 mmol/L (ref 135–145)

## 2019-05-06 LAB — CBC
HCT: 43.3 % (ref 36.0–46.0)
Hemoglobin: 14.2 g/dL (ref 12.0–15.0)
MCH: 30.6 pg (ref 26.0–34.0)
MCHC: 32.8 g/dL (ref 30.0–36.0)
MCV: 93.3 fL (ref 80.0–100.0)
Platelets: 296 10*3/uL (ref 150–400)
RBC: 4.64 MIL/uL (ref 3.87–5.11)
RDW: 13.7 % (ref 11.5–15.5)
WBC: 12.9 10*3/uL — ABNORMAL HIGH (ref 4.0–10.5)
nRBC: 0 % (ref 0.0–0.2)

## 2019-05-06 LAB — CBG MONITORING, ED: Glucose-Capillary: 76 mg/dL (ref 70–99)

## 2019-05-06 MED ORDER — SODIUM CHLORIDE 0.9% FLUSH
3.0000 mL | Freq: Once | INTRAVENOUS | Status: AC
  Administered 2019-05-07: 3 mL via INTRAVENOUS

## 2019-05-06 MED ORDER — SODIUM CHLORIDE 0.9 % IV BOLUS
1000.0000 mL | Freq: Once | INTRAVENOUS | Status: AC
  Administered 2019-05-06: 23:00:00 1000 mL via INTRAVENOUS

## 2019-05-06 NOTE — ED Notes (Signed)
CBG Results reported to [place], Therapist, sports.

## 2019-05-06 NOTE — ED Provider Notes (Signed)
[facility]   CSN: [id number] Arrival date & time: 05/06/19  2013    History   Chief Complaint Chief Complaint  Patient presents with   Altered Mental Status   Weakness   Near Syncope    HPI Hailey Ortiz is a 68 y.o. female with a hx of Tneshia, GERD, hypertension presents to the Emergency Department after episode of syncope at home around 5 PM tonight.  Patient is nonverbal secondary to her dementia.  Level 5 caveat for dementia.  History is provided by the daughter who is an ICU nurse.  Daughter reports that around 5 PM after an episode of diarrhea, patient had a syncopal episode.  She did hit her head.  Daughter was not with her mother at the time.  Daughter reports that upon her arrival to her mother's home, her mother was pale and somewhat diaphoretic.  She reports 2 additional episodes of pallor and diaphoresis between the initial syncope and arrival here at the hospital but no additional syncopal episodes.  Daughter reports that diarrhea is baseline for her mother due to irritable bowel syndrome.  Daughter reports that mother seems to be back to normal at this time while lying in bed.  She reports no additional altered mental status.  Daughter reports patient has had a handful of syncopal episodes in the past most commonly associated with UTI or dehydration.  Daughter reports that patient just finished a course of Cipro for recent UTI.  Denies fevers, chills, cough, known sick contacts.  Daughter also reports patient fell approximately 1 week ago striking her right knee.  Daughter reports that several years ago mother had an internal degloving of the right knee which required several surgeries to fix and she is concerned about persistent swelling.  Patient has not been evaluated for this since the fall.     The history is provided by medical records and a relative. No language interpreter was used.    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) 08/26/2017   Degloving injury of lower leg, right, sequela 08/03/2017    Class: Chronic   Abscess of knee    Hematoma of leg, right, sequela    Right leg swelling 07/18/2017   Anemia 07/18/2017   HTN (hypertension) 07/18/2017   Hyperlipidemia 07/18/2017    Past Surgical History:  Procedure Laterality Date   ABDOMINAL HYSTERECTOMY     total   APPENDECTOMY     CHOLECYSTECTOMY     I&D EXTREMITY Right 07/23/2017   Procedure: DEBRIDEMENT RIGHT KNEE WOUND;  Surgeon: Nadara Mustarduda, Marcus V, MD;  [facility];  Service: Orthopedics;  Laterality: Right;   I&D EXTREMITY Right 07/25/2017   Procedure: REPEAT DEBRIDEMENT RIGHT KNEE WOUND;  Surgeon: Nadara Mustarduda, Marcus V, MD;  [facility];  Service: Orthopedics;  Laterality: Right;   I&D EXTREMITY Right 08/06/2017   Procedure: RIGHT LEG DEBRIDEMENT HEMATOMA;  Surgeon: Nadara Mustarduda, Marcus V, MD;  [facility];  Service: Orthopedics;  Laterality: Right;   I&D EXTREMITY Right 09/05/2017  leg & wound vac placed/notes 09/05/2017   I&D EXTREMITY Right 09/05/2017   Procedure: RIGHT LEG DEBRIDEMENT OF ULCER, APPLY WOUND VAC;  Surgeon: Nadara Mustard, MD;  Location: [facility] OR;  Service: Orthopedics;  Laterality: Right;     OB History   No obstetric history on file.      Home Medications    Prior to Admission medications   Medication Sig Start Date End Date Taking? Authorizing Provider  aspirin EC 81 MG tablet Take 81 mg by mouth every evening.    [provider]  Calcium Carb-Cholecalciferol (CALCIUM-VITAMIN D) 500-200  MG-UNIT tablet Take 1 tablet by mouth at bedtime.    [provider]  cephALEXin (KEFLEX) 500 MG capsule Take 1 capsule (500 mg total) by mouth 4 (four) times daily. 05/07/19   Meloni Hinz, Dahlia Client, PA-C  cholecalciferol (VITAMIN D) 1000 units tablet Take 1,000 Units by mouth daily.    [provider]  cyanocobalamin (,VITAMIN B-12,) 1000 MCG/ML injection Inject 1,000 mcg into the muscle every Thursday.  07/04/16   [provider]  famotidine (PEPCID) 20 MG tablet Take 20 mg by mouth at bedtime.    [provider]  folic acid (FOLVITE) 400 MCG tablet Take 400 mcg by mouth daily. 07/01/16   [provider]  furosemide (LASIX) 20 MG tablet Take 20 mg by mouth daily.    [provider]  HYDROcodone-acetaminophen (NORCO/VICODIN) 5-325 MG tablet Take 1 tablet every 6 (six) hours as needed by mouth for moderate pain. 08/15/17   Adonis Huguenin, NP  HYDROcodone-acetaminophen (NORCO/VICODIN) 5-325 MG tablet Take 1 tablet by mouth every 4 (four) hours as needed for moderate pain ((score 4 to 6)). 09/08/17   Nadara Mustard, MD  hydrOXYzine (ATARAX/VISTARIL) 10 MG tablet Take 10 mg by mouth 3 (three) times daily as needed for anxiety.    [provider]  metoprolol succinate (TOPROL-XL) 50 MG 24 hr tablet Take 12.5 mg by mouth daily.  06/21/16   [provider]  Multiple Vitamin (MULTIVITAMIN WITH MINERALS) TABS tablet Take 1 tablet by mouth daily.    [provider]  nitrofurantoin, macrocrystal-monohydrate, (MACROBID) 100 MG capsule Take 1 capsule (100 mg total) by mouth 2 (two) times daily. 08/03/18   Elvina Sidle, MD  Probiotic Product (PROBIOTIC PO) Take 1 capsule by mouth daily.    [provider]  sertraline (ZOLOFT) 100 MG tablet Take 100 mg by mouth at bedtime.    [provider]  simvastatin (ZOCOR) 40 MG tablet Take 40 mg by mouth at bedtime.  07/10/16   [provider]    sulfamethoxazole-trimethoprim (BACTRIM DS,SEPTRA DS) 800-160 MG tablet Take 1 tablet by mouth 2 (two) times daily. 09/16/17   Nadara Mustard, MD    Family History Family History  Problem Relation Age of Onset   Heart failure Mother    Kidney Stones Mother    Macular degeneration Mother    Cancer Mother    Rectal cancer Father    Arthritis/Rheumatoid Father    Colon cancer Father    Cancer Father     Social History Social History   Tobacco Use   Smoking status: Never Smoker   Smokeless tobacco: Never Used  Substance Use Topics   Alcohol use: No   Drug use: No     Allergies   Patient has no known allergies.   Review of Systems Review of Systems  Unable to perform ROS: Dementia     Physical Exam Updated Vital  Signs BP 119/71 (BP Location: Left Arm)    Pulse (!) 101    Temp 98.4 F (36.9 C) (Oral)    Resp 20    SpO2 100%   Physical Exam Vitals signs and nursing note reviewed.  Constitutional:      General: She is not in acute distress.    Appearance: She is not diaphoretic.  HENT:     Head: Normocephalic.  Eyes:     General: No scleral icterus.    Conjunctiva/sclera: Conjunctivae normal.  Neck:     Musculoskeletal: Normal range of motion.  Cardiovascular:     Rate and Rhythm: Normal rate and regular rhythm.     Pulses: Normal pulses.          Radial pulses are 2+ on the right side and 2+ on the left side.     Heart sounds: Normal heart sounds.  Pulmonary:     Effort: No tachypnea, accessory muscle usage, prolonged expiration, respiratory distress or retractions.     Breath sounds: Normal breath sounds. No stridor.     Comments: Equal chest rise. No increased work of breathing. Abdominal:     General: There is no distension.     Palpations: Abdomen is soft.     Tenderness: There is no abdominal tenderness. There is no guarding or rebound.  Musculoskeletal:     Right knee: She exhibits swelling and ecchymosis.     Comments: Moves all  extremities equally and without difficulty. Right knee with some decreased range of motion however this appears to be baseline for patient per daughter.  Numerous well-healed surgical incisions of the right knee.  Ecchymosis appears to be healing however joint effusion remains persistent.  Skin:    General: Skin is warm and dry.     Capillary Refill: Capillary refill takes less than 2 seconds.  Neurological:     Mental Status: She is alert.     Comments: Aphasic - baseline  Psychiatric:        Mood and Affect: Mood normal.      ED Treatments / Results  Labs (all labs ordered are listed, but only abnormal results are displayed) Labs Reviewed  BASIC METABOLIC PANEL - Abnormal; Notable for the following components:      Result Value   CO2 21 (*)    Calcium 8.6 (*)    All other components within normal limits  CBC - Abnormal; Notable for the following components:   WBC 12.9 (*)    All other components within normal limits  URINALYSIS, ROUTINE W REFLEX MICROSCOPIC - Abnormal; Notable for the following components:   APPearance HAZY (*)    Hgb urine dipstick SMALL (*)    Ketones, ur 5 (*)    Nitrite POSITIVE (*)    Leukocytes,Ua MODERATE (*)    WBC, UA >50 (*)    Bacteria, UA MANY (*)    All other components within normal limits  HEPATIC FUNCTION PANEL - Abnormal; Notable for the following components:   Total Bilirubin 1.3 (*)    Bilirubin, Direct 0.3 (*)    Indirect Bilirubin 1.0 (*)    All other components within normal limits  SARS CORONAVIRUS 2 (HOSPITAL ORDER, PERFORMED IN [facility])  URINE CULTURE  LIPASE, BLOOD  CBG MONITORING, ED  TROPONIN I (HIGH SENSITIVITY)  TROPONIN I (HIGH SENSITIVITY)    EKG EKG Interpretation  Date/Time:  Thursday May 06 2019 21:20:27 EDT Ventricular Rate:  61 PR Interval:  134 QRS Duration: 92  QT Interval:  478 QTC Calculation: 482 R Axis:   57 Text Interpretation:  Sinus rhythm Low voltage, precordial leads Abnormal  R-wave progression, early transition No significant change since last tracing Confirmed by Merrily Pew [phone]) on 05/07/2019 2:30:38 AM     Radiology Ct Head Wo Contrast  Result Date: 05/07/2019 CLINICAL DATA:  Initial evaluation for acute syncope. EXAM: CT HEAD WITHOUT CONTRAST TECHNIQUE: Contiguous axial images were obtained from the base of the skull through the vertex without intravenous contrast. COMPARISON:  None available. FINDINGS: Brain: Diffuse prominence of the CSF containing spaces compatible with generalized age-related cerebral atrophy. Patchy hypodensity involving the supratentorial cerebral white matter most consistent with chronic small vessel ischemic disease, mild in nature. No acute intracranial hemorrhage. No acute large vessel territory infarct. No mass lesion, midline shift or mass effect no hydrocephalus. No extra-axial fluid collection. Vascular: No hyperdense vessel. Scattered vascular calcifications noted within the carotid siphons. Skull: Scalp soft tissues and calvarium within normal limits. Sinuses/Orbits: Globes and orbital soft tissues demonstrate no acute finding. Paranasal sinuses and mastoid air cells are largely clear. Other: None. IMPRESSION: 1. No acute intracranial abnormality. 2. Moderately advanced cerebral atrophy with mild chronic small vessel ischemic disease. Electronically Signed   By: Jeannine Boga M.D.   On: 05/07/2019 01:42   Dg Chest Port 1 View  Result Date: 05/07/2019 CLINICAL DATA:  Syncope EXAM: PORTABLE CHEST 1 VIEW COMPARISON:  None. FINDINGS: Mild scarring in the lingula/lateral left lung base. No focal consolidation. No pleural effusion or pneumothorax. The heart is normal in size. IMPRESSION: No evidence of acute cardiopulmonary disease. Electronically Signed   By: Julian Hy M.D.   On: 05/07/2019 00:03   Dg Knee Complete 4 Views Right  Result Date: 05/07/2019 CLINICAL DATA:  Syncope, fall EXAM: RIGHT KNEE - COMPLETE 4+ VIEW  COMPARISON:  None. FINDINGS: No fracture or dislocation is seen. Mild degenerative changes with tiny patellofemoral osteophytes and sharpening of the tibial spines. The visualized soft tissues are unremarkable. No suprapatellar knee joint effusion. IMPRESSION: No fracture or dislocation is seen. Electronically Signed   By: Julian Hy M.D.   On: 05/07/2019 00:07    Procedures Procedures (including critical care time)  Medications Ordered in ED Medications  sodium chloride 0.9 % bolus 500 mL (has no administration in time range)  sodium chloride flush (NS) 0.9 % injection 3 mL (3 mLs Intravenous Given 05/07/19 0233)  sodium chloride 0.9 % bolus 1,000 mL (0 mLs Intravenous Stopped 05/07/19 0029)  cefTRIAXone (ROCEPHIN) 1 g in sodium chloride 0.9 % 100 mL IVPB (0 g Intravenous Stopped 05/07/19 0311)     Initial Impression / Assessment and Plan / ED Course  I have reviewed the triage vital signs and the nursing notes.  Pertinent labs & imaging results that were available during my care of the patient were reviewed by me and considered in my medical decision making (see chart for details).  Clinical Course as of May 06 428  Thu May 06, 2019  2239 Bridgit Eynon - [phone] Daughter   [HM]  Fri May 07, 2019  0356 Discussed findings with family.  They do not wish to have patient admitted.  Pt has been given fluids for her orthostasis and Rocephin for her persistent UTI.  Long discussion with daughter about admission vs d/c.  Daughter wishes to d/c home with outpatient antibiotics and close monitoring.  Pt's daughter is an ICU nurse    [HM]    Clinical Course User Index [HM]  Tylee Yum, Dahlia ClientHannah, PA-C        Patient presents after syncopal episode.  Mild leukocytosis is noted, CBG within normal limits.  Initial tachycardia at triage however no tachycardia on my exam.  Labs and x-rays pending.  Daughter does not want patient admitted to the hospital.  We will have this discussion  after results.  4:29 AM  Patient's work-up is largely reassuring.  No evidence of intracranial hemorrhage on her CT scan.  No evidence of fracture on her knee x-ray and no evidence of pneumonia or infiltrates on her chest x-ray.  Lab work is also reassuring.  No significant anemia.  No arrhythmias here in the emergency department and EKG is without ischemia.  Initial troponin negative.  Low likelihood of acute coronary syndrome given patient's presentation today.  She is significantly dehydrated with orthostatic hypotension.  Fluids given.  Orthostatic VS for the past 24 hrs:  BP- Lying Pulse- Lying BP- Sitting Pulse- Sitting BP- Standing at 0 minutes Pulse- Standing at 0 minutes  05/07/19 0318 111/59 63 120/71 63 105/87 102  05/06/19 2310 135/66 -- 128/69 -- (!) 97/17 --    Distantly, patient has persistent urinary tract infection.  Suspect organism with resistance to Cipro.  Patient given Rocephin here in the emergency department.  Discussion with patient's daughter.  Patient's daughter continues to wish for discharge home.  I do think this is reasonable.  Will treat urinary tract infection outpatient.  Urine culture pending.  Discussed with daughter the importance of close observation and swift return to the emergency department for new or worsening symptoms.  Letter states understanding and is in agreement with this plan.  The patient was discussed with and seen by Dr. Clayborne DanaMesner who agrees with the treatment plan.   Final Clinical Impressions(s) / ED Diagnoses   Final diagnoses:  Urinary tract infection without hematuria, site unspecified  Syncope, unspecified syncope type  Orthostatic hypotension    ED Discharge Orders         Ordered    cephALEXin (KEFLEX) 500 MG capsule  4 times daily     05/07/19 0429           Hailey Ditter, Dahlia ClientHannah, PA-C 05/07/19 0431    Mesner, Barbara CowerJason, MD 05/07/19 [phone]

## 2019-05-06 NOTE — ED Triage Notes (Signed)
To ED for eval after syncopal episode today while in [place]. pts daughter went to [place] (to pts home), picked pt up, and drove her to [facility]. Pt is minimally verbal per daughter. Hx of dementia. Incontinent. Family denies fevers. Pt has had 2 syncopal episodes since picking up from home.

## 2019-05-07 ENCOUNTER — Emergency Department (HOSPITAL_COMMUNITY): Payer: Medicare Other

## 2019-05-07 DIAGNOSIS — N390 Urinary tract infection, site not specified: Secondary | ICD-10-CM | POA: Diagnosis not present

## 2019-05-07 LAB — URINALYSIS, ROUTINE W REFLEX MICROSCOPIC
Bilirubin Urine: NEGATIVE
Glucose, UA: NEGATIVE mg/dL
Ketones, ur: 5 mg/dL — AB
Nitrite: POSITIVE — AB
Protein, ur: NEGATIVE mg/dL
Specific Gravity, Urine: 1.016 (ref 1.005–1.030)
WBC, UA: >50 WBC/hpf — ABNORMAL HIGH (ref 0–5)
pH: 6 (ref 5.0–8.0)

## 2019-05-07 LAB — HEPATIC FUNCTION PANEL
ALT: 15 U/L (ref 0–44)
AST: 26 U/L (ref 15–41)
Albumin: 3.7 g/dL (ref 3.5–5.0)
Alkaline Phosphatase: 85 U/L (ref 38–126)
Bilirubin, Direct: 0.3 mg/dL — ABNORMAL HIGH (ref 0.0–0.2)
Indirect Bilirubin: 1 mg/dL — ABNORMAL HIGH (ref 0.3–0.9)
Total Bilirubin: 1.3 mg/dL — ABNORMAL HIGH (ref 0.3–1.2)
Total Protein: 6.6 g/dL (ref 6.5–8.1)

## 2019-05-07 LAB — TROPONIN I (HIGH SENSITIVITY): Troponin I (High Sensitivity): 3 ng/L (ref <18)

## 2019-05-07 LAB — LIPASE, BLOOD: Lipase: 25 U/L (ref 11–51)

## 2019-05-07 LAB — SARS CORONAVIRUS 2 BY RT PCR (HOSPITAL ORDER, PERFORMED IN ~~LOC~~ HOSPITAL LAB): SARS Coronavirus 2: NEGATIVE

## 2019-05-07 MED ORDER — CEPHALEXIN 500 MG PO CAPS: 500.0000 mg | ORAL_CAPSULE | Freq: Four times a day (QID) | ORAL | 0 refills | Status: AC

## 2019-05-07 MED ORDER — SODIUM CHLORIDE 0.9 % IV BOLUS: 500.0000 mL | Freq: Once | INTRAVENOUS | Status: DC

## 2019-05-07 MED ORDER — SODIUM CHLORIDE 0.9 % IV SOLN
1.0000 g | Freq: Once | INTRAVENOUS | Status: AC
  Administered 2019-05-07: 1 g via INTRAVENOUS
  Filled 2019-05-07: qty 10

## 2019-05-07 NOTE — ED Notes (Addendum)
Pt family verbalized understanding of pt discharge and follow up care. Pt changed placed in fresh blue scrubs and wheeled to lobby.  Family with pt to drive them home

## 2019-05-07 NOTE — Discharge Instructions (Addendum)
1. Medications: Keflex, usual home medications °2. Treatment: rest, drink plenty of fluids, take medications as prescribed °3. Follow Up: Please followup with your primary doctor in 3 days for discussion of your diagnoses and further evaluation after today's visit; if you do not have a primary care doctor use the resource guide provided to find one; return to the ER for fevers, persistent vomiting, worsening abdominal pain or other concerning symptoms. ° °

## 2019-05-07 NOTE — ED Notes (Signed)
Cancel 2nd trop

## 2019-05-09 LAB — URINE CULTURE: Culture: >=100000 — AB

## 2019-05-10 ENCOUNTER — Telehealth: Payer: Self-pay | Admitting: Emergency Medicine

## 2019-05-10 NOTE — Telephone Encounter (Signed)
Post ED Visit - Positive Culture Follow-up: Successful Patient Follow-Up  Culture assessed and recommendations reviewed by:  []  Elenor Quinones, Pharm.D. [x]  Heide Guile, Pharm.D., BCPS AQ-ID []  Parks Neptune, Pharm.D., BCPS []  Alycia Rossetti, Pharm.D., BCPS []  [place], Pharm.D., BCPS, AAHIVP []  Legrand Como, Pharm.D., BCPS, AAHIVP []  Salome Arnt, PharmD, BCPS []  Johnnette Gourd, PharmD, BCPS []  Hughes Better, PharmD, BCPS []  Leeroy Cha, PharmD  Positive urine culture  []  Patient discharged without antimicrobial prescription and treatment is now indicated [x]  Organism is resistant to prescribed ED discharge antimicrobial []  Patient with positive blood cultures  Changes discussed with ED provider: Okey Regal PA New antibiotic prescription stop cephalexin, start fosfomycin 3grams po x 1 Called to [facility] [place] [phone]  Spoke with EC and explained need to change medication   Hazle Nordmann 05/10/2019, 11:56 AM

## 2019-05-10 NOTE — Progress Notes (Signed)
ED Antimicrobial Stewardship Positive Culture Follow Up   Hailey Ortiz is an 68 y.o. female who presented to [facility] on 05/06/2019 with a chief complaint of  Chief Complaint  Patient presents with  . Altered Mental Status  . Weakness  . Near Syncope    Recent Results (from the past 720 hour(s))  SARS Coronavirus 2 (CEPHEID- Performed in [place] hospital lab), Hosp Order     Status: None   Collection Time: 05/06/19 11:45 PM   Specimen: Nasopharyngeal Swab  Result Value Ref Range Status   SARS Coronavirus 2 NEGATIVE NEGATIVE Final       Urine culture     Status: Abnormal   Collection Time: 05/07/19  1:35 AM   Specimen: Urine, Catheterized  Result Value Ref Range Status   Specimen Description URINE, CATHETERIZED  Final   Special Requests   Final    NONE Performed at [facility], [address] [place], [place] [address]    Culture (A)  Final    >=100,000 COLONIES/mL KLEBSIELLA PNEUMONIAE Confirmed Extended Spectrum Beta-Lactamase Producer (ESBL).  In bloodstream infections from ESBL organisms, carbapenems are preferred over piperacillin/tazobactam. They are shown to have a lower risk of mortality.    Report Status 05/09/2019 FINAL  Final   Organism ID, Bacteria KLEBSIELLA PNEUMONIAE (A)  Final      Susceptibility   Klebsiella pneumoniae - MIC*    AMPICILLIN >=32 RESISTANT Resistant     CEFAZOLIN >=64 RESISTANT Resistant     CEFTRIAXONE >=64 RESISTANT Resistant     CIPROFLOXACIN >=4 RESISTANT Resistant     GENTAMICIN <=1 SENSITIVE Sensitive     IMIPENEM 0.5 SENSITIVE Sensitive     NITROFURANTOIN 256 RESISTANT Resistant     TRIMETH/SULFA >=320 RESISTANT Resistant     AMPICILLIN/SULBACTAM >=32 RESISTANT Resistant     PIP/TAZO 32 INTERMEDIATE Intermediate     Extended ESBL POSITIVE Resistant     * >=100,000 COLONIES/mL KLEBSIELLA PNEUMONIAE    [x]  Treated with cephalexin, organism resistant to prescribed antimicrobial  New antibiotic prescription:  Fosfomycin 3g po x 1 dose  ED Provider: Lenn Sink, PA-C   Hailey Ortiz 05/10/2019, 9:50 AM Clinical Pharmacist Monday - Friday phone -  [phone] Saturday - Sunday phone - [phone]

## 2019-11-16 ENCOUNTER — Emergency Department (HOSPITAL_COMMUNITY): Payer: Medicare Other

## 2019-11-16 ENCOUNTER — Emergency Department (HOSPITAL_COMMUNITY)
Admission: EM | Admit: 2019-11-16 | Discharge: 2019-11-16 | Disposition: A | Discharge status: Home / Self Care | Payer: Medicare Other | Attending: Emergency Medicine | Admitting: Emergency Medicine

## 2019-11-16 ENCOUNTER — Emergency Department (HOSPITAL_BASED_OUTPATIENT_CLINIC_OR_DEPARTMENT_OTHER): Payer: Medicare Other

## 2019-11-16 ENCOUNTER — Other Ambulatory Visit: Payer: Self-pay

## 2019-11-16 DIAGNOSIS — Y939 Activity, unspecified: Secondary | ICD-10-CM | POA: Diagnosis not present

## 2019-11-16 DIAGNOSIS — Y999 Unspecified external cause status: Secondary | ICD-10-CM | POA: Insufficient documentation

## 2019-11-16 DIAGNOSIS — M25531 Pain in right wrist: Secondary | ICD-10-CM | POA: Insufficient documentation

## 2019-11-16 DIAGNOSIS — W07XXXA Fall from chair, initial encounter: Secondary | ICD-10-CM | POA: Diagnosis not present

## 2019-11-16 DIAGNOSIS — F0390 Unspecified dementia without behavioral disturbance: Secondary | ICD-10-CM | POA: Diagnosis not present

## 2019-11-16 DIAGNOSIS — M7120 Synovial cyst of popliteal space [Baker], unspecified knee: Secondary | ICD-10-CM

## 2019-11-16 DIAGNOSIS — Z7982 Long term (current) use of aspirin: Secondary | ICD-10-CM | POA: Insufficient documentation

## 2019-11-16 DIAGNOSIS — Z79899 Other long term (current) drug therapy: Secondary | ICD-10-CM | POA: Insufficient documentation

## 2019-11-16 DIAGNOSIS — M25569 Pain in unspecified knee: Secondary | ICD-10-CM

## 2019-11-16 DIAGNOSIS — Y92019 Unspecified place in single-family (private) house as the place of occurrence of the external cause: Secondary | ICD-10-CM | POA: Diagnosis not present

## 2019-11-16 DIAGNOSIS — I10 Essential (primary) hypertension: Secondary | ICD-10-CM | POA: Diagnosis not present

## 2019-11-16 DIAGNOSIS — W19XXXA Unspecified fall, initial encounter: Secondary | ICD-10-CM

## 2019-11-16 DIAGNOSIS — R609 Edema, unspecified: Secondary | ICD-10-CM

## 2019-11-16 DIAGNOSIS — R52 Pain, unspecified: Secondary | ICD-10-CM

## 2019-11-16 DIAGNOSIS — M25562 Pain in left knee: Secondary | ICD-10-CM | POA: Insufficient documentation

## 2019-11-16 DIAGNOSIS — S8992XA Unspecified injury of left lower leg, initial encounter: Secondary | ICD-10-CM | POA: Diagnosis present

## 2019-11-16 DIAGNOSIS — M25539 Pain in unspecified wrist: Secondary | ICD-10-CM

## 2019-11-16 DIAGNOSIS — R479 Unspecified speech disturbances: Secondary | ICD-10-CM | POA: Diagnosis not present

## 2019-11-16 NOTE — ED Notes (Signed)
Patient transported to X-Ray 

## 2019-11-16 NOTE — ED Notes (Signed)
Family updated as to patient's status and POC for additional studies

## 2019-11-16 NOTE — Progress Notes (Signed)
Left lower extremity venous duplex complete. Preliminary results indicate the LLE is negative for DVT, incidentally noted: a left sided baker's cyst.  Preliminary results called to DR. Steinl @ 19:45. Syngo images and preliminary results are temporarily unavailable at the time of this note. Levin Bacon- RDMS, RVT 7:49 PM  11/16/2019

## 2019-11-16 NOTE — Discharge Instructions (Addendum)
Please follow up with your primary care provider within 5-7 days for re-evaluation of your symptoms. If you do not have a primary care provider, information for a healthcare clinic has been provided for you to make arrangements for follow up care. Please return to the emergency department for any new or worsening symptoms. ° °

## 2019-11-16 NOTE — ED Provider Notes (Signed)
[facility] EMERGENCY DEPARTMENT Provider Note   CSN: [id number] Arrival date & time: 11/16/19  1505     History Chief Complaint  Patient presents with  . Knee Pain    Hailey Ortiz is a 69 y.o. female.  HPI   69 year old female with history of confusion, congenital abnormality of the kidney, dementia, GERD, headache, hematoma, hyperlipidemia, hypertension, who presents the emergency department today for evaluation after a fall.  Patient was in her normal state of health and was with her caretaker when she slid out of her recliner onto her bilateral knees.  She did not sustain any head trauma.  Family is concerned because she seemed to have pain to the left knee and they thought that it was swollen and red.  She also was having difficulty standing and seem to be in pain so they brought her here for further evaluation.  They are also concerned that the patient is to have pain to the right wrist.  History was obtained by family as patient is nonverbal at baseline.  History is somewhat limited secondary to this.  Patient's daughter is a Engineer, civil (consulting) and states that patient has 24/7 care in the home at this time.    Past Medical History:  Diagnosis Date  . Confusion   . Congenital abnormality of kidney   . Dementia (HCC)   . GERD (gastroesophageal reflux disease)   . Headache    migraine  . Hematoma    right leg  . Hypercholesteremia   . Hypertension   . Venous stasis ulcer (HCC)     Patient Active Problem List   Diagnosis Date Noted  . 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 Degloving injury of lower leg, right, sequela 08/03/2017    Class: Chronic  . Abscess of knee   . Hematoma of leg, right, sequela   . Right leg swelling 07/18/2017  . Anemia 07/18/2017  . HTN (hypertension) 07/18/2017  . Hyperlipidemia 07/18/2017    Past Surgical History:  Procedure Laterality Date  . ABDOMINAL  HYSTERECTOMY     total  . APPENDECTOMY    . CHOLECYSTECTOMY    . I & D EXTREMITY Right 07/23/2017   Procedure: DEBRIDEMENT RIGHT KNEE WOUND;  Surgeon: Nadara Mustard, MD;  Location: [facility] OR;  Service: Orthopedics;  Laterality: Right;  . I & D EXTREMITY Right 07/25/2017   Procedure: REPEAT DEBRIDEMENT RIGHT KNEE WOUND;  Surgeon: Nadara Mustard, MD;  Location[facility] OR;  Service: Orthopedics;  Laterality: Right;  . I & D EXTREMITY Right 08/06/2017   Procedure: RIGHT LEG DEBRIDEMENT HEMATOMA;  Surgeon: Nadara Mustard, MD;  Location: [facility] OR;  Service: Orthopedics;  Laterality: Right;  . I & D EXTREMITY Right 09/05/2017   leg & wound vac placed/notes 09/05/2017  . I & D EXTREMITY Right 09/05/2017   Procedure: RIGHT LEG DEBRIDEMENT OF ULCER, APPLY WOUND VAC;  Surgeon: Nadara Mustard, MD;  Location: [facility] OR;  Service: Orthopedics;  Laterality: Right;     OB History   No obstetric history on file.     Family History  Problem Relation Age of Onset  . Heart failure Mother   . Kidney Stones Mother   . Macular degeneration Mother   . Cancer Mother   . Rectal cancer Father   . Arthritis/Rheumatoid Father   . Colon cancer Father   . Cancer Father     Social History   Tobacco Use  .  Smoking status: Never Smoker  . Smokeless tobacco: Never Used  Substance Use Topics  . Alcohol use: No  . Drug use: No    Home Medications Prior to Admission medications   Medication Sig Start Date End Date Taking? Authorizing Provider  aspirin EC 81 MG tablet Take 81 mg by mouth every evening.    [provider]  Calcium Carb-Cholecalciferol (CALCIUM-VITAMIN D) 500-200 MG-UNIT tablet Take 1 tablet by mouth at bedtime.    [provider]  cephALEXin (KEFLEX) 500 MG capsule Take 1 capsule (500 mg total) by mouth 4 (four) times daily. 05/07/19   Muthersbaugh, Dahlia Client, PA-C  cholecalciferol (VITAMIN D) 1000 units tablet Take 1,000 Units by mouth daily.    [provider]  cyanocobalamin  (,VITAMIN B-12,) 1000 MCG/ML injection Inject 1,000 mcg into the muscle every Thursday.  07/04/16   [provider]  famotidine (PEPCID) 20 MG tablet Take 20 mg by mouth at bedtime.    [provider]  folic acid (FOLVITE) 400 MCG tablet Take 400 mcg by mouth daily. 07/01/16   [provider]  furosemide (LASIX) 20 MG tablet Take 20 mg by mouth daily.    [provider]  HYDROcodone-acetaminophen (NORCO/VICODIN) 5-325 MG tablet Take 1 tablet every 6 (six) hours as needed by mouth for moderate pain. 08/15/17   Adonis Huguenin, NP  HYDROcodone-acetaminophen (NORCO/VICODIN) 5-325 MG tablet Take 1 tablet by mouth every 4 (four) hours as needed for moderate pain ((score 4 to 6)). 09/08/17   Nadara Mustard, MD  hydrOXYzine (ATARAX/VISTARIL) 10 MG tablet Take 10 mg by mouth 3 (three) times daily as needed for anxiety.    [provider]  metoprolol succinate (TOPROL-XL) 50 MG 24 hr tablet Take 12.5 mg by mouth daily.  06/21/16   [provider]  Multiple Vitamin (MULTIVITAMIN WITH MINERALS) TABS tablet Take 1 tablet by mouth daily.    [provider]  nitrofurantoin, macrocrystal-monohydrate, (MACROBID) 100 MG capsule Take 1 capsule (100 mg total) by mouth 2 (two) times daily. 08/03/18   Elvina Sidle, MD  Probiotic Product (PROBIOTIC PO) Take 1 capsule by mouth daily.    [provider]  sertraline (ZOLOFT) 100 MG tablet Take 100 mg by mouth at bedtime.    [provider]  simvastatin (ZOCOR) 40 MG tablet Take 40 mg by mouth at bedtime.  07/10/16   [provider]  sulfamethoxazole-trimethoprim (BACTRIM DS,SEPTRA DS) 800-160 MG tablet Take 1 tablet by mouth 2 (two) times daily. 09/16/17   Nadara Mustard, MD    Allergies    Patient has no known allergies.  Review of Systems   Review of Systems  Reason unable to perform ROS: nonverbal.  Musculoskeletal:       Knee pain, wrist pain  Neurological:       No head  trauma, no loc    Physical Exam Updated Vital Signs BP 129/63 (BP Location: Right Arm)   Pulse 79   Temp 98.4 F (36.9 C) (Oral)   Resp 18   SpO2 97%   Physical Exam Vitals and nursing note reviewed.  Constitutional:      General: She is not in acute distress.    Appearance: She is well-developed.  HENT:     Head: Normocephalic and atraumatic.  Eyes:     Conjunctiva/sclera: Conjunctivae normal.  Neck:     Comments: No c spine ttp Cardiovascular:     Rate and Rhythm: Normal rate.  Pulmonary:  Effort: Pulmonary effort is normal.  Musculoskeletal:     Cervical back: Neck supple.     Comments: Appears to have some pain to the left knee and ankle with palpation. LLE slightly swollen compared to right. Right ankle appears somewhat swollen.  Skin:    General: Skin is warm and dry.  Neurological:     Mental Status: She is alert.     Comments: nonverbal     ED Results / Procedures / Treatments   Labs (all labs ordered are listed, but only abnormal results are displayed) Labs Reviewed - No data to display  EKG None  Radiology DG Wrist Complete Right  Result Date: 11/16/2019 CLINICAL DATA:  Right wrist pain after fall. Slight out of bed. EXAM: RIGHT WRIST - COMPLETE 3+ VIEW COMPARISON:  None. FINDINGS: There is no evidence of fracture or dislocation. Bones are under mineralized. Mild degenerative radiocarpal joint space narrowing. There is no evidence of arthropathy or other focal bone abnormality. Soft tissues are unremarkable. IMPRESSION: No fracture or subluxation of the right wrist. Electronically Signed   By: Narda Rutherford M.D.   On: 11/16/2019 20:46   DG Ankle Complete Left  Result Date: 11/16/2019 CLINICAL DATA:  Pain. Slid out of bed. EXAM: LEFT ANKLE COMPLETE - 3+ VIEW COMPARISON:  None. FINDINGS: No acute fracture or dislocation is identified. Spurring is noted at the tip of the lateral malleolus. There is moderate diffuse soft tissue swelling about the ankle.  A small plantar calcaneal enthesophyte is noted. IMPRESSION: Soft tissue swelling without acute osseous abnormality. Electronically Signed   By: Sebastian Ache M.D.   On: 11/16/2019 18:20   DG Knee Complete 4 Views Left  Result Date: 11/16/2019 CLINICAL DATA:  Left knee pain after fall yesterday EXAM: LEFT KNEE - COMPLETE 4+ VIEW COMPARISON:  None. FINDINGS: Frontal, bilateral oblique, lateral views of the left knee are obtained. No fracture, subluxation, or dislocation. Mild medial compartmental joint space narrowing and osteophyte formation. No joint effusion. IMPRESSION: 1. Mild medial compartment osteoarthritis.  No acute fracture. Electronically Signed   By: Sharlet Salina M.D.   On: 11/16/2019 15:49   DG HIP UNILAT WITH PELVIS 2-3 VIEWS LEFT  Result Date: 11/16/2019 CLINICAL DATA:  Slid out of bed. Left hip pain. EXAM: DG HIP (WITH OR WITHOUT PELVIS) 2-3V LEFT COMPARISON:  None. FINDINGS: The cortical margins of the bony pelvis and left hip are intact. No fracture. Pubic symphysis and sacroiliac joints are congruent. Pubic rami are intact. Bilateral hip osteoarthritis with acetabular spurring. Both femoral heads are well-seated in the respective acetabula. Pelvic calcifications typical of phleboliths. Scattered surgical clips. IMPRESSION: Osteoarthritis of both hips.  No acute pelvic or hip fracture. Electronically Signed   By: Narda Rutherford M.D.   On: 11/16/2019 18:21    Procedures Procedures (including critical care time)  Medications Ordered in ED Medications - No data to display  ED Course  I have reviewed the triage vital signs and the nursing notes.  Pertinent labs & imaging results that were available during my care of the patient were reviewed by me and considered in my medical decision making (see chart for details).    MDM Rules/Calculators/A&P                      69 year old female with history of confusion, congenital abnormality of the kidney, dementia, GERD, headache,  hematoma, hyperlipidemia, hypertension, who presents the emergency department today for evaluation after a fall.  Patient was in  her normal state of health and was with her caretaker when she slid out of her recliner onto her bilateral knees.  She did not sustain any head trauma.  Family is concerned because she seemed to have pain to the left knee and they thought that it was swollen and red.  She also was having difficulty standing and seem to be in pain so they brought her here for further evaluation.  They are also concerned that the patient is to have pain to the right wrist.  X-ray left knee without evidence of acute traumatic injury did show evidence of arthritis Left ankle x-ray did not show evidence of acute bony abnormality X-ray pelvis with left hip without evidence of fracture or acute bony abnormality X-ray right wrist negative for acute traumatic injury  Lower extremity ultrasound showed no evidence of DVT but did show evidence of Baker's cyst which could explain the patient's symptoms.  On reassessment discussed the work-up and findings with the patient's daughter at bedside.  Discussed treatment for Baker's cyst.  She voiced that she feels like they may need extra help with the patient at home.  They do have 24-hour sitters and she is a nurse who has been caring for the patient up until now but she is needing a hospital bed and feels that home health would be beneficial.  Social work was consulted who evaluated the patient.  Orders were placed.  Patient to follow-up with PCP and return for new or worsening symptoms.  Daughter at bedside voiced understanding of the plan and reasons to return.  All questions answered.  Patient stable for discharge.  Final Clinical Impression(s) / ED Diagnoses Final diagnoses:  Fall, initial encounter  Acute knee pain, unspecified laterality  Pain in wrist, unspecified laterality  Synovial cyst of popliteal space, unspecified laterality    Rx / DC  Orders ED Discharge Orders    None       Rodney Booze, PA-C 11/17/19 0027    Lajean Saver, MD 11/17/19 1226

## 2019-11-16 NOTE — ED Notes (Signed)
Spoke with daughter on the phone, daughter expressed concern with right hand as well as home health equipment. Daughter also expressed concern regarding previous episode with other knee. Provider notified.

## 2019-11-16 NOTE — ED Triage Notes (Signed)
Pt arrives with family from home- dementia pt and is nonverbal. Pt slid out of bed and hit left knee. Knee is red/swollen. Pt is not able to walk due to the pain.

## 2019-11-16 NOTE — Care Management (Signed)
ED CM faxed in DME orders for Hospital Bed and hoyer lift to [facility] in [place] and HH orders to [facility] in [place], [facility] CM will follow up tomorrow.

## 2019-11-16 NOTE — ED Notes (Signed)
Patient transported to XR. 

## 2019-11-17 ENCOUNTER — Telehealth: Payer: Self-pay | Admitting: *Deleted

## 2019-11-17 ENCOUNTER — Telehealth (HOSPITAL_COMMUNITY): Payer: Self-pay | Admitting: Emergency Medicine

## 2019-11-17 ENCOUNTER — Telehealth: Payer: Self-pay | Admitting: Surgery

## 2019-11-17 DIAGNOSIS — F0390 Unspecified dementia without behavioral disturbance: Secondary | ICD-10-CM

## 2019-11-17 DIAGNOSIS — M160 Bilateral primary osteoarthritis of hip: Secondary | ICD-10-CM

## 2019-11-17 NOTE — Telephone Encounter (Signed)
EDCM has been in contact with daughter concerning mom's hospital bed at home.  EDCM has contacted several HH agencies in pt home and surrounding areas until obtaining approval from [facility] [id number]). [facility] faxed requested information to [phone].

## 2019-11-17 NOTE — Telephone Encounter (Signed)
Was asked by Burna Mortimer, Case Manager RN, to place orders for a bed for this patient. This was done as a Research officer, political party to my colleague, Cortni, Couture, PA-C, who saw the patient, but was not able to be immediately present currently.

## 2019-11-17 NOTE — Telephone Encounter (Signed)
   ED CM spoke with patient's daughter concerning hospital bed and the DME company needing additional information, CM spoke with EDP and order were amended and faxed out to [facility] in [place] [phone]  Called to confirm receipt. CM will update patient's dautgher Shawna Orleans. No further ED CM needs identified.

## 2019-11-17 NOTE — Telephone Encounter (Signed)
Pt daughter called regarding hospital bed DME. Daughter states she called the company she was referred to and they did not have record of order.  EDCM reached out to [place] at [facility] to assist with this matter.  Zack took referral and will contact daughter prior to delivery of bed.  Information relayed to daughter.

## 2021-10-07 DEATH — deceased
